# Patient Record
Sex: Female | Born: 1970 | Race: Black or African American | Hispanic: No | Marital: Single | Smoking: Current every day smoker
Health system: Southern US, Community
[De-identification: ages and names within clinical notes are randomized; demographics above are authoritative.]

---

## 2000-01-25 ENCOUNTER — Inpatient Hospital Stay (HOSPITAL_COMMUNITY): Admission: AD | Admit: 2000-01-25 | Discharge: 2000-01-25 | Payer: Self-pay | Admitting: Obstetrics

## 2003-09-11 ENCOUNTER — Emergency Department (HOSPITAL_COMMUNITY): Admission: EM | Admit: 2003-09-11 | Discharge: 2003-09-11 | Payer: Self-pay | Admitting: Emergency Medicine

## 2004-01-21 ENCOUNTER — Emergency Department (HOSPITAL_COMMUNITY): Admission: AD | Admit: 2004-01-21 | Discharge: 2004-01-21 | Payer: Self-pay | Admitting: Family Medicine

## 2004-02-02 ENCOUNTER — Emergency Department (HOSPITAL_COMMUNITY): Admission: EM | Admit: 2004-02-02 | Discharge: 2004-02-02 | Payer: Self-pay | Admitting: Family Medicine

## 2004-05-04 ENCOUNTER — Emergency Department (HOSPITAL_COMMUNITY): Admission: EM | Admit: 2004-05-04 | Discharge: 2004-05-04 | Payer: Self-pay | Admitting: Family Medicine

## 2004-12-06 ENCOUNTER — Emergency Department (HOSPITAL_COMMUNITY): Admission: EM | Admit: 2004-12-06 | Discharge: 2004-12-06 | Payer: Self-pay | Admitting: Family Medicine

## 2005-05-01 ENCOUNTER — Emergency Department (HOSPITAL_COMMUNITY): Admission: EM | Admit: 2005-05-01 | Discharge: 2005-05-01 | Payer: Self-pay | Admitting: Family Medicine

## 2005-05-29 IMAGING — CR DG CHEST 2V
2 series · 2 of 2 positions shown · non-contrast
Comparison: none

CLINICAL DATA: Shortness of breath. 
 CHEST - TWO VIEW:

[view not recorded (1 of 2)]
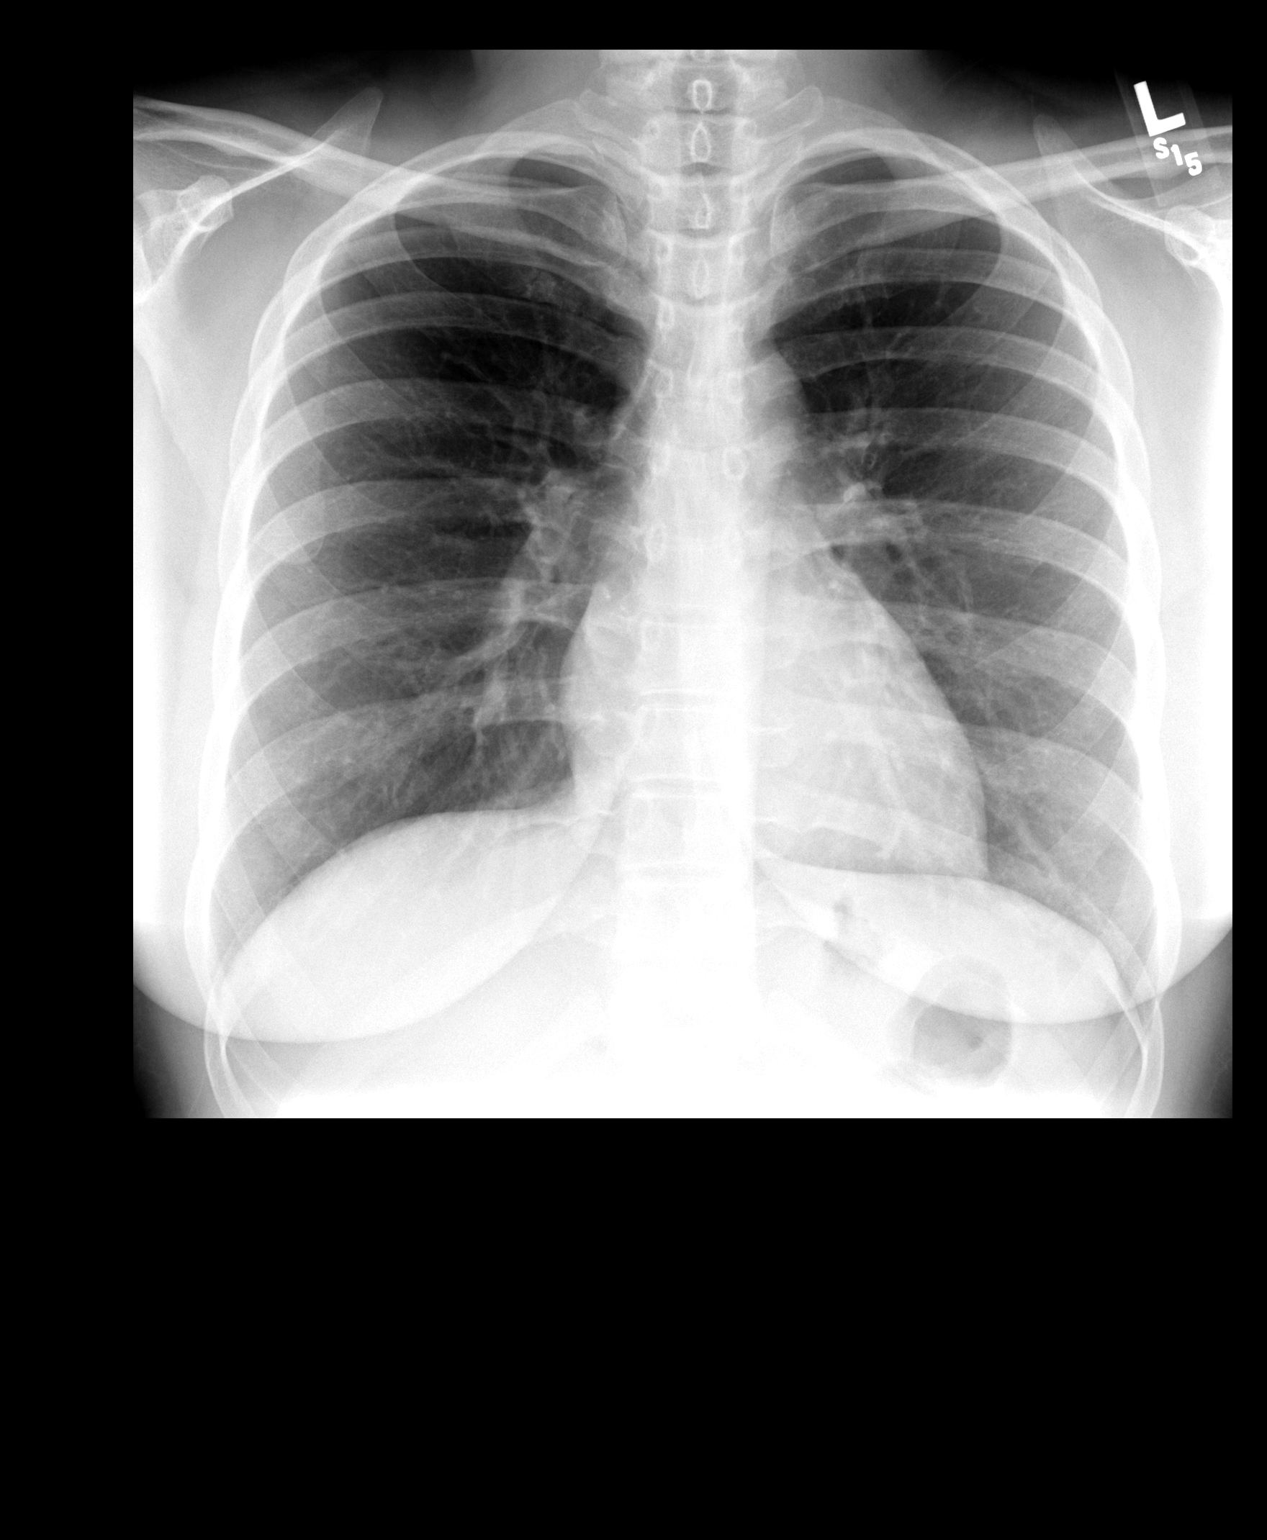

[view not recorded (2 of 2)]
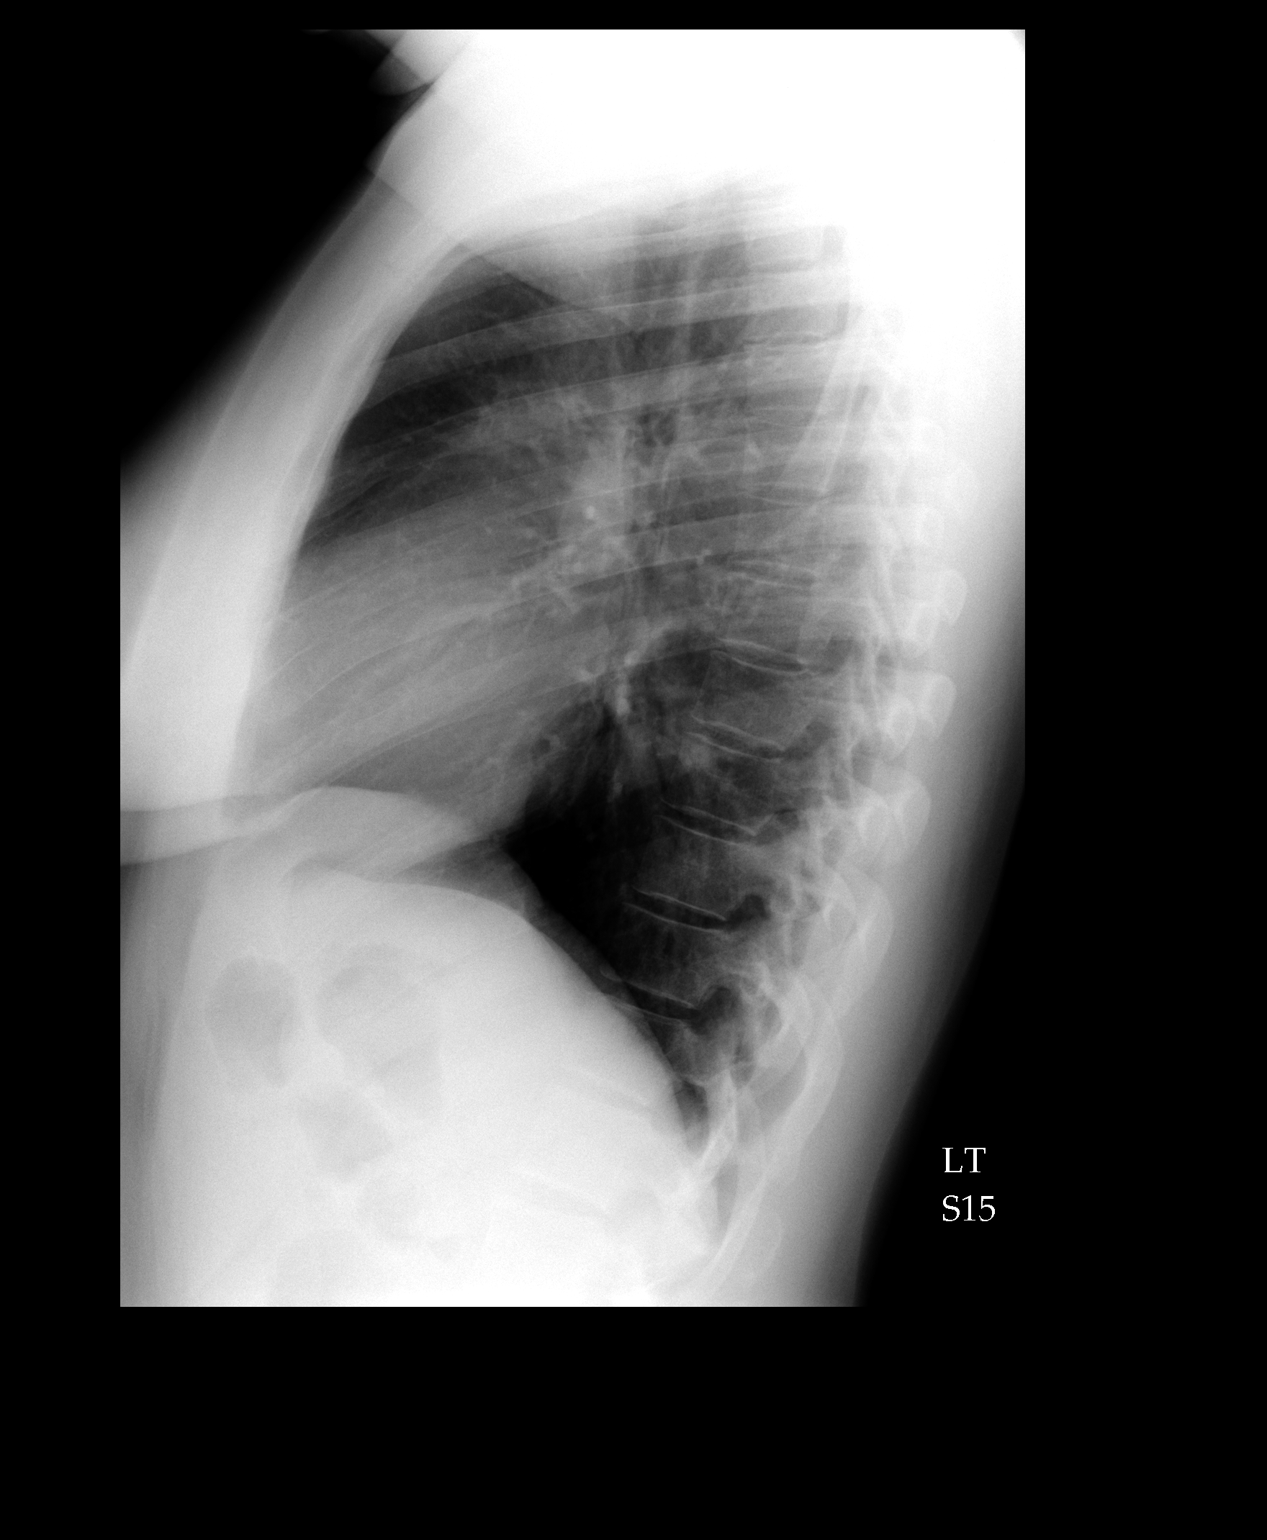

[2 of 2 positions shown; findings below may reference images not displayed]

FINDINGS: Lungs are clear.  No pleural effusion.  Heart and mediastinum appear normal.  No bony abnormality.
IMPRESSION: Negative chest.

## 2006-01-09 ENCOUNTER — Emergency Department (HOSPITAL_COMMUNITY): Admission: EM | Admit: 2006-01-09 | Discharge: 2006-01-09 | Payer: Self-pay | Admitting: Emergency Medicine

## 2006-04-13 ENCOUNTER — Emergency Department (HOSPITAL_COMMUNITY): Admission: EM | Admit: 2006-04-13 | Discharge: 2006-04-13 | Payer: Self-pay | Admitting: Emergency Medicine

## 2006-12-10 ENCOUNTER — Emergency Department (HOSPITAL_COMMUNITY): Admission: EM | Admit: 2006-12-10 | Discharge: 2006-12-10 | Payer: Self-pay | Admitting: Family Medicine

## 2007-04-19 ENCOUNTER — Emergency Department (HOSPITAL_COMMUNITY): Admission: EM | Admit: 2007-04-19 | Discharge: 2007-04-19 | Payer: Self-pay | Admitting: Emergency Medicine

## 2007-04-20 ENCOUNTER — Emergency Department (HOSPITAL_COMMUNITY): Admission: EM | Admit: 2007-04-20 | Discharge: 2007-04-20 | Payer: Self-pay | Admitting: Family Medicine

## 2007-05-08 ENCOUNTER — Emergency Department (HOSPITAL_COMMUNITY): Admission: EM | Admit: 2007-05-08 | Discharge: 2007-05-08 | Payer: Self-pay | Admitting: Emergency Medicine

## 2007-06-23 ENCOUNTER — Emergency Department (HOSPITAL_COMMUNITY): Admission: EM | Admit: 2007-06-23 | Discharge: 2007-06-23 | Payer: Self-pay | Admitting: Family Medicine

## 2008-12-21 ENCOUNTER — Ambulatory Visit: Payer: Self-pay | Admitting: Gynecologic Oncology

## 2009-01-21 ENCOUNTER — Ambulatory Visit: Payer: Self-pay | Admitting: Gynecologic Oncology

## 2009-01-26 ENCOUNTER — Ambulatory Visit: Payer: Self-pay | Admitting: Gynecologic Oncology

## 2009-02-20 ENCOUNTER — Ambulatory Visit: Payer: Self-pay | Admitting: Gynecologic Oncology

## 2011-08-04 LAB — POCT URINALYSIS DIP (DEVICE)
Glucose, UA: NEGATIVE
Hgb urine dipstick: NEGATIVE
Nitrite: NEGATIVE
Specific Gravity, Urine: 1.015
Urobilinogen, UA: 0.2
pH: 6

## 2011-08-04 LAB — POCT PREGNANCY, URINE: Preg Test, Ur: NEGATIVE

## 2011-08-09 LAB — POCT URINALYSIS DIP (DEVICE)
Bilirubin Urine: NEGATIVE
Glucose, UA: NEGATIVE
Hgb urine dipstick: NEGATIVE
Ketones, ur: NEGATIVE
Nitrite: NEGATIVE
Operator id: 247071
Protein, ur: NEGATIVE
Specific Gravity, Urine: 1.01
Urobilinogen, UA: 0.2
pH: 6

## 2011-08-09 LAB — WET PREP, GENITAL
Trich, Wet Prep: NONE SEEN
Yeast Wet Prep HPF POC: NONE SEEN

## 2011-08-09 LAB — URINE CULTURE: Colony Count: 6000

## 2011-08-09 LAB — GC/CHLAMYDIA PROBE AMP, GENITAL
Chlamydia, DNA Probe: NEGATIVE
GC Probe Amp, Genital: NEGATIVE

## 2011-08-09 LAB — POCT PREGNANCY, URINE: Operator id: 247071

## 2011-08-18 ENCOUNTER — Inpatient Hospital Stay (INDEPENDENT_AMBULATORY_CARE_PROVIDER_SITE_OTHER)
Admission: RE | Admit: 2011-08-18 | Discharge: 2011-08-18 | Disposition: A | Discharge status: Home / Self Care | Payer: 59 | Source: Ambulatory Visit | Attending: Emergency Medicine | Admitting: Emergency Medicine

## 2011-08-18 DIAGNOSIS — R109 Unspecified abdominal pain: Secondary | ICD-10-CM

## 2011-11-24 ENCOUNTER — Emergency Department (INDEPENDENT_AMBULATORY_CARE_PROVIDER_SITE_OTHER)
Admission: EM | Admit: 2011-11-24 | Discharge: 2011-11-24 | Disposition: A | Discharge status: Home / Self Care | Payer: 59 | Source: Home / Self Care

## 2011-11-24 ENCOUNTER — Encounter (HOSPITAL_COMMUNITY): Payer: Self-pay | Admitting: Emergency Medicine

## 2011-11-24 DIAGNOSIS — N390 Urinary tract infection, site not specified: Secondary | ICD-10-CM

## 2011-11-24 LAB — POCT PREGNANCY, URINE: Preg Test, Ur: NEGATIVE

## 2011-11-24 LAB — POCT URINALYSIS DIP (DEVICE)
Glucose, UA: NEGATIVE mg/dL
Hgb urine dipstick: NEGATIVE
Specific Gravity, Urine: 1.015 (ref 1.005–1.030)

## 2011-11-24 MED ORDER — CIPROFLOXACIN HCL 500 MG PO TABS: 500.0000 mg | ORAL_TABLET | Freq: Two times a day (BID) | ORAL | Status: AC

## 2011-11-24 MED ORDER — MELOXICAM 7.5 MG PO TABS: 7.5000 mg | ORAL_TABLET | Freq: Every day | ORAL | Status: DC

## 2011-11-24 MED ORDER — PHENAZOPYRIDINE HCL 200 MG PO TABS: 200.0000 mg | ORAL_TABLET | Freq: Three times a day (TID) | ORAL | Status: AC | PRN

## 2011-11-24 NOTE — ED Provider Notes (Signed)
History     CSN: [id number]  Arrival date & time 11/24/11  1928   None     Chief Complaint  Patient presents with  . Urinary Tract Infection    (Consider location/radiation/quality/duration/timing/severity/associated sxs/prior treatment) HPI Comments: 41 y/o smoker female no significant PMH. Here c/o burning at the end of urination, urgency and frequency for 3 weeks. Used monistat vaginal cream with improvement of her symptoms but dysuria is back, denies vaginal discharge or itchiness. No pelvic pain. No fever or chills, has had nausea but no vomiting.    History reviewed. No pertinent past medical history.  History reviewed. No pertinent past surgical history.  No family history on file.  History  Substance Use Topics  . Smoking status: Current Everyday Smoker  . Smokeless tobacco: Not on file  . Alcohol Use: No    OB History    Grav Para Term Preterm Abortions TAB SAB Ect Mult Living                  Review of Systems  Constitutional: Negative for fever and chills.  Gastrointestinal: Positive for nausea. Negative for vomiting, abdominal pain and diarrhea.  Genitourinary: Positive for dysuria, urgency and frequency. Negative for hematuria, flank pain, vaginal bleeding, vaginal discharge, genital sores, menstrual problem and pelvic pain.  Neurological: Negative for dizziness and headaches.    Allergies  Review of patient's allergies indicates no known allergies.  Home Medications   Current Outpatient Rx  Name Route Sig Dispense Refill  . CIPROFLOXACIN HCL 500 MG PO TABS Oral Take 1 tablet (500 mg total) by mouth every 12 (twelve) hours. 10 tablet 0  . MELOXICAM 7.5 MG PO TABS Oral Take 1 tablet (7.5 mg total) by mouth daily. As needed. 30 tablet 0  . PHENAZOPYRIDINE HCL 200 MG PO TABS Oral Take 1 tablet (200 mg total) by mouth 3 (three) times daily as needed for pain. 10 tablet 0    BP 120/76  Pulse 91  Temp(Src) 98 F (36.7 C) (Oral)  Resp 16  SpO2 99%   LMP 11/12/2011  Physical Exam  Nursing note and vitals reviewed. Constitutional: She is oriented to person, place, and time. She appears well-developed and well-nourished. No distress.  Cardiovascular: Normal heart sounds.   Pulmonary/Chest: Breath sounds normal.  Abdominal: Soft. There is no tenderness.       No CVT  Neurological: She is alert and oriented to person, place, and time.    ED Course  Procedures (including critical care time)  Labs Reviewed  POCT URINALYSIS DIP (DEVICE) - Abnormal; Notable for the following:    Ketones, ur TRACE (*)    Leukocytes, UA SMALL (*) Biochemical Testing Only. Please order routine urinalysis from main lab if confirmatory testing is needed.   All other components within normal limits  POCT PREGNANCY, URINE  LAB REPORT - SCANNED   No results found.   1. UTI (lower urinary tract infection)       MDM  Treated with Cipro and pyridium.         Sharin Grave, MD 11/26/11 1034

## 2011-11-24 NOTE — ED Notes (Signed)
PT HEREWITH UTI SX FREQ/URGE AND DYSURIA.SX STARTED X 3 WKS AGO AND USED THE MONISTAT X 3 DYS AND SX IMPROVED BUT RETURNED X 1 WEEK LATER.NO FEVERS,CHILLS,N,V.NO BURN OR ITCH NOTED

## 2011-11-27 ENCOUNTER — Telehealth (HOSPITAL_COMMUNITY): Payer: Self-pay | Admitting: *Deleted

## 2011-12-15 ENCOUNTER — Emergency Department (HOSPITAL_COMMUNITY)
Admission: EM | Admit: 2011-12-15 | Discharge: 2011-12-16 | Disposition: A | Discharge status: Home / Self Care | Payer: 59 | Attending: Emergency Medicine | Admitting: Emergency Medicine

## 2011-12-15 ENCOUNTER — Emergency Department (HOSPITAL_COMMUNITY): Payer: 59

## 2011-12-15 ENCOUNTER — Encounter (HOSPITAL_COMMUNITY): Payer: Self-pay | Admitting: Emergency Medicine

## 2011-12-15 DIAGNOSIS — R209 Unspecified disturbances of skin sensation: Secondary | ICD-10-CM | POA: Insufficient documentation

## 2011-12-15 DIAGNOSIS — R109 Unspecified abdominal pain: Secondary | ICD-10-CM | POA: Insufficient documentation

## 2011-12-15 DIAGNOSIS — R102 Pelvic and perineal pain: Secondary | ICD-10-CM

## 2011-12-15 DIAGNOSIS — N949 Unspecified condition associated with female genital organs and menstrual cycle: Secondary | ICD-10-CM | POA: Insufficient documentation

## 2011-12-15 LAB — URINALYSIS, ROUTINE W REFLEX MICROSCOPIC
Bilirubin Urine: NEGATIVE
Glucose, UA: NEGATIVE mg/dL
Hgb urine dipstick: NEGATIVE
Ketones, ur: NEGATIVE mg/dL
Leukocytes, UA: NEGATIVE
Nitrite: NEGATIVE
Protein, ur: NEGATIVE mg/dL
Specific Gravity, Urine: 1.007 (ref 1.005–1.030)
Urobilinogen, UA: 0.2 mg/dL (ref 0.0–1.0)
pH: 7.5 (ref 5.0–8.0)

## 2011-12-15 LAB — WET PREP, GENITAL
Clue Cells Wet Prep HPF POC: NONE SEEN
Trich, Wet Prep: NONE SEEN
Yeast Wet Prep HPF POC: NONE SEEN

## 2011-12-15 LAB — POCT PREGNANCY, URINE: Preg Test, Ur: NEGATIVE

## 2011-12-15 MED ORDER — IBUPROFEN 800 MG PO TABS
800.0000 mg | ORAL_TABLET | Freq: Once | ORAL | Status: AC
  Administered 2011-12-15: 800 mg via ORAL
  Filled 2011-12-15: qty 1

## 2011-12-15 NOTE — ED Notes (Signed)
Pt to and from pelvic room with NA.

## 2011-12-15 NOTE — ED Notes (Signed)
Pt to RR for CCUS.

## 2011-12-15 NOTE — ED Provider Notes (Signed)
History     CSN: [id number]  Arrival date & time 12/15/11  1933   First MD Initiated Contact with Patient 12/15/11 2032      Chief Complaint  Patient presents with  . Flank Pain    (Consider location/radiation/quality/duration/timing/severity/associated sxs/prior treatment) Patient is a 41 y.o. female presenting with flank pain. The history is provided by the patient.  Flank Pain This is a recurrent problem. The current episode started more than 1 month ago. The problem occurs intermittently. The problem has been gradually worsening. Associated symptoms include abdominal pain and urinary symptoms. Pertinent negatives include no change in bowel habit, chills, fever, numbness or vomiting.  Patient reports persistent, intermittent right flank pain since October. Was seen by her GYN, discussed the pain with her GYN and apparently had a normal annual exam. Was seen at the urgent care in late October and put on meloxicam which she said seemed to help initially but pain soon returned. [facility] early February and treated for a UTI. Symptoms actually seemed to get worse during that time. She now denies dysuria but states she does have a sense that she's not emptying her bladder when she urinates. And states the pain at times radiates into her right leg and recently she has started to experience right lower pelvic pain as well. States the right lower pelvic pain is now her main concern. States was told at the urgent care if her pain persisted she may need to come to the emergency department for an ultrasound. Patient denies vaginal discharge, states she has not been sexually active in over 4 years and as previously mentioned had a normal annual exam in October. History reviewed. No pertinent past medical history.  History reviewed. No pertinent past surgical history.  History reviewed. No pertinent family history.  History  Substance Use Topics  . Smoking status: Current  Everyday Smoker    Types: Cigarettes  . Smokeless tobacco: Not on file  . Alcohol Use: No    OB History    Grav Para Term Preterm Abortions TAB SAB Ect Mult Living                  Review of Systems  Constitutional: Negative.  Negative for fever and chills.  HENT: Negative.   Eyes: Negative.   Respiratory: Negative.   Cardiovascular: Negative.   Gastrointestinal: Positive for abdominal pain. Negative for vomiting and change in bowel habit.  Genitourinary: Positive for flank pain.  Musculoskeletal: Negative.   Skin: Negative.   Neurological: Negative.  Negative for numbness.  Hematological: Negative.   Psychiatric/Behavioral: Negative.     Allergies  Review of patient's allergies indicates no known allergies.  Home Medications   Current Outpatient Rx  Name Route Sig Dispense Refill  . MELOXICAM 7.5 MG PO TABS Oral Take 7.5 mg by mouth daily.      BP 125/84  Pulse 87  Temp(Src) 98.5 F (36.9 C) (Oral)  SpO2 100%  LMP 12/05/2011  Physical Exam  Constitutional: She is oriented to person, place, and time. She appears well-developed and well-nourished.  HENT:  Head: Normocephalic and atraumatic.  Eyes: Conjunctivae are normal.  Neck: Neck supple.  Cardiovascular: Normal rate and regular rhythm.   Pulmonary/Chest: Effort normal and breath sounds normal.  Abdominal: Soft. Bowel sounds are normal.  Genitourinary: Vagina normal. Cervix exhibits no motion tenderness, no discharge and no friability. Right adnexum displays tenderness.       Exam somewhat limited due  to pt's body habitus. Noted what appears to be tiny flesh colored piece of tissue protruding from cervix.    Musculoskeletal: Normal range of motion.  Neurological: She is alert and oriented to person, place, and time.  Skin: Skin is warm and dry. No erythema.  Psychiatric: She has a normal mood and affect.    ED Course  Procedures  And findings and clinical impression discussed patient who reports some  relief of discomfort with ibuprofen. Will plan for discharge home on 3 day regimen of ibuprofen and short course of medication for pain. Patient is to arrange follow up with her primary care physician inside of her insurance network for further evaluation of persistent right side and right lower pelvic pain..   Labs Reviewed  URINALYSIS, ROUTINE W REFLEX MICROSCOPIC  POCT PREGNANCY, URINE  WET PREP, GENITAL  GC/CHLAMYDIA PROBE AMP, GENITAL   No results found.   No diagnosis found.    MDM  HPI/PE and clinical findings c/w  1. Persistent, intermittent  (R) flank and lower pelvic pain x 4 months (u/a negative, wet prep unremarkable, pelvic u/s w/o acute findings, remaining PE unremarkable) This has components of possible muscle or ligament strain. Will try NSAIDS.        Leanne Chang, NP 12/16/11 [phone]

## 2011-12-15 NOTE — ED Notes (Signed)
Provided pt with crackers and PB

## 2011-12-15 NOTE — ED Notes (Signed)
Patient with pain in right flank.  Patient has been dealing with same pain for months, pain getting worse and now it is going into groin.

## 2011-12-15 NOTE — ED Notes (Signed)
MD in to see pt.  Urine to lab.  Pt resting.

## 2011-12-16 LAB — GC/CHLAMYDIA PROBE AMP, GENITAL
Chlamydia, DNA Probe: NEGATIVE
GC Probe Amp, Genital: NEGATIVE

## 2011-12-16 MED ORDER — HYDROCODONE-ACETAMINOPHEN 5-325 MG PO TABS: 1.0000 | ORAL_TABLET | ORAL | Status: AC | PRN

## 2011-12-16 MED ORDER — IBUPROFEN 400 MG PO TABS: 400.0000 mg | ORAL_TABLET | Freq: Three times a day (TID) | ORAL | Status: AC | PRN

## 2011-12-16 NOTE — Discharge Instructions (Signed)
Please review the instructions below. Take the ibuprofen 3 times a day for 3 days as directed and then only as needed. Take the medication for pain as directed. Continue your effort at getting established with a primary care physician and arrange followup for further evaluation of your persistent right flank and lower pelvic pain. Return if symptoms become acutely worse.  Abdominal Pain Many things can cause belly (abdominal) pain. Most times, the belly pain is not dangerous. The amount of belly pain does not tell how serious the problem may be. Many cases of belly pain can be watched and treated at home. HOME CARE   Do not take medicines that help you go poop (laxatives) unless told to by your doctor.   Only take medicine as told by your doctor.   Eat or drink as told by your doctor. Your doctor will tell you if you should be on a special diet.  GET HELP RIGHT AWAY IF:   The pain does not go away.   You have a fever.   You keep throwing up (vomiting).   The pain changes and is only in the right or left part of the belly.   You have bloody or tarry looking poop.  MAKE SURE YOU:   Understand these instructions.   Will watch your condition.   Will get help right away if you are not doing well or get worse.  Document Released: 03/27/2008 Document Revised: 06/21/2011 Document Reviewed: 10/25/2009 [facility] Patient Information 2012 [place], [place].Flank Pain Flank pain refers to pain that is located on the side of the body between the upper abdomen and the back. It can be caused by many things. CAUSES  Some of the more common causes of flank pain include:  Muscle strain.   Muscle spasms.   A disease of your spine (vertebral disk disease).   A lung infection (pneumonia).   Fluid around your lungs (pulmonary edema).   A kidney infection.   Kidney stones.   A very painful skin rash on only one side of your body (shingles).   Gallbladder disease.  DIAGNOSIS  Blood tests,  urine tests, and X-rays may help your caregiver determine what is wrong. TREATMENT  The treatment of pain depends on the cause. Your caregiver will determine what treatment will work best for you. HOME CARE INSTRUCTIONS   Home care will depend on the cause of your pain.   Some medications may help relieve the pain. Take medication for relief of pain as directed by your caregiver.   Tell your caregiver about any changes in your pain.   Follow up with your caregiver.  SEEK IMMEDIATE MEDICAL CARE IF:   Your pain is not controlled with medication.   The pain increases.   You have abdominal pain.   You have shortness of breath.   You have persistent nausea or vomiting.   You have swelling in your abdomen.   You feel faint or pass out.   You have a temperature by mouth above 102 F (38.9 C), not controlled by medicine.  MAKE SURE YOU:   Understand these instructions.   Will watch your condition.   Will get help right away if you are not doing well or get worse.  Document Released: 11/30/2005 Document Revised: 06/21/2011 Document Reviewed: 03/26/2010 [facility] Patient Information 2012 [place], [place].

## 2011-12-16 NOTE — ED Provider Notes (Signed)
See prior note   Lyndzee Kliebert L Wade Sigala, MD 12/16/11 1337 

## 2012-06-06 IMAGING — US US PELVIS COMPLETE
1 series · 14 of 25 positions shown · non-contrast
Comparison: None.

CLINICAL DATA: 40-year-old female with lower quadrant and flank
pain.

TRANSABDOMINAL AND TRANSVAGINAL ULTRASOUND OF PELVIS
TECHNIQUE: Both transabdominal and transvaginal ultrasound
examinations of the pelvis were performed. Transabdominal technique
was performed for global imaging of the pelvis including uterus,
ovaries, adnexal regions, and pelvic cul-de-sac.

[Series 1: us pelvis complete · 0.21mm/px · 14 of 61 slices shown]
[im 1/61]
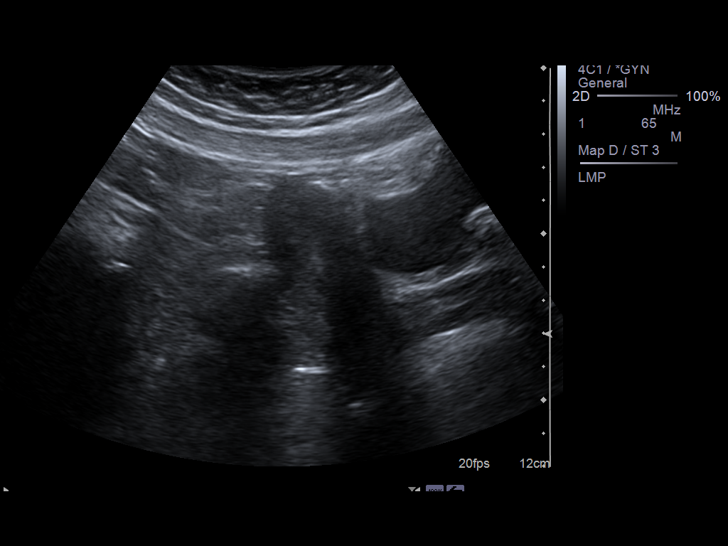
[im 6/61]
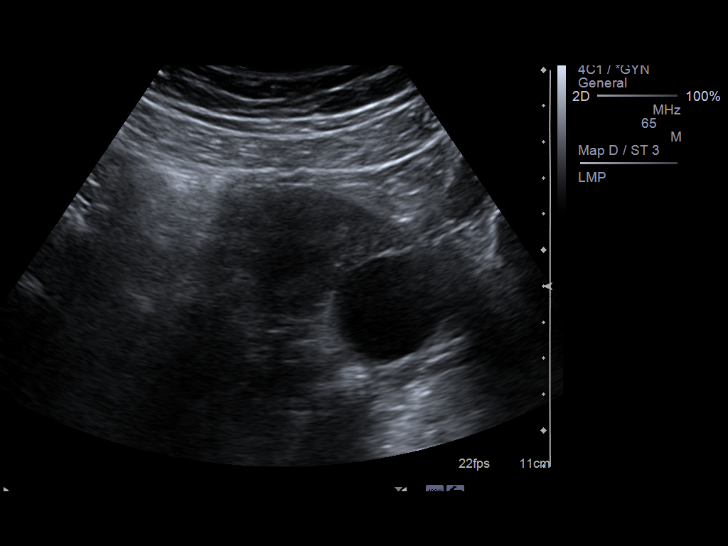
[im 11/61]
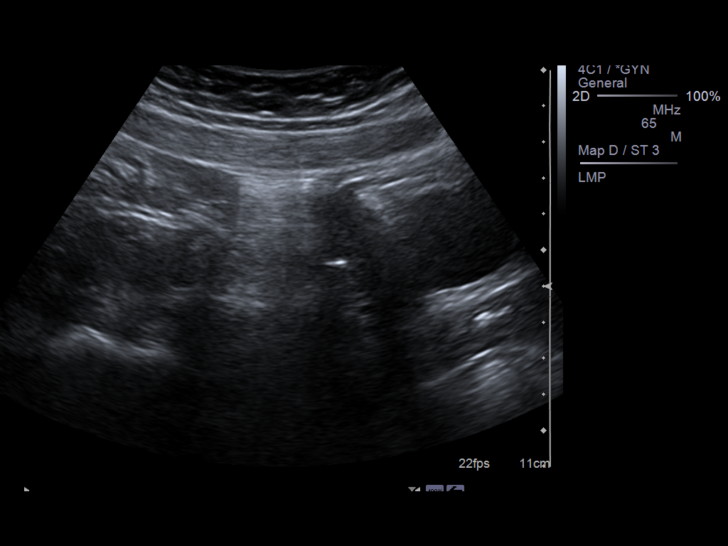
[im 16/61]
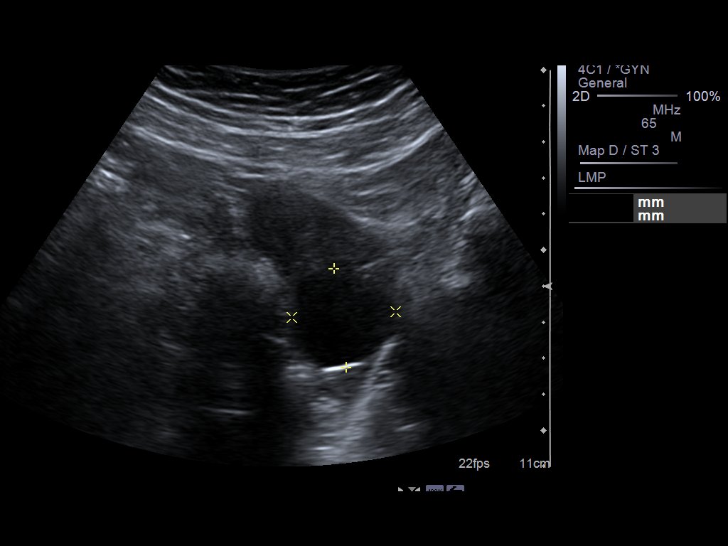
[im 21/61]
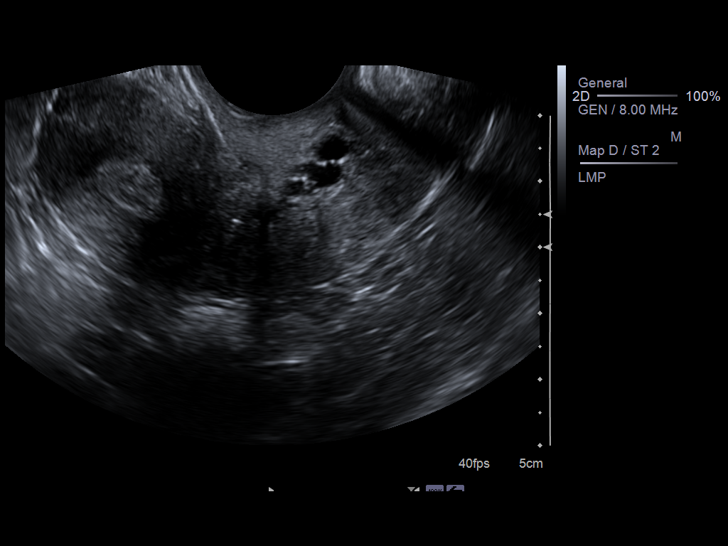
[im 23/61]
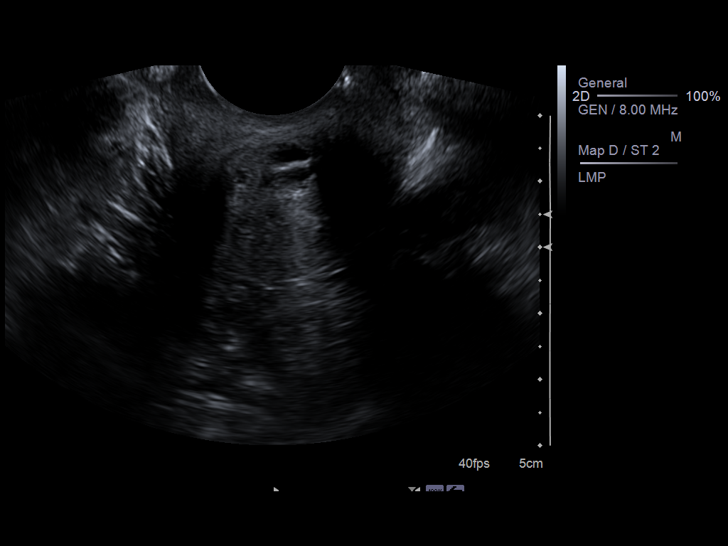
[im 28/61]
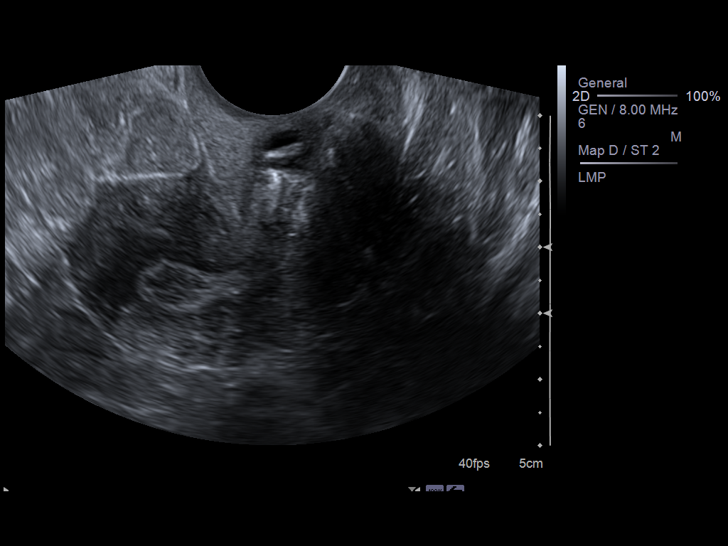
[im 33/61]
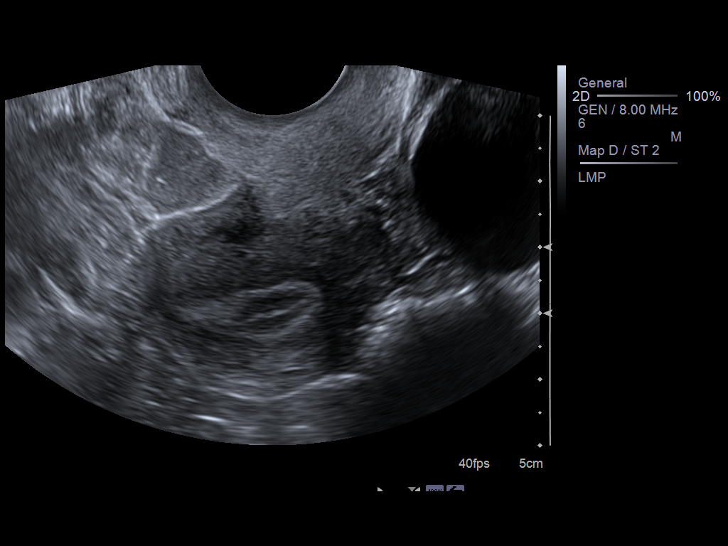
[im 38/61]
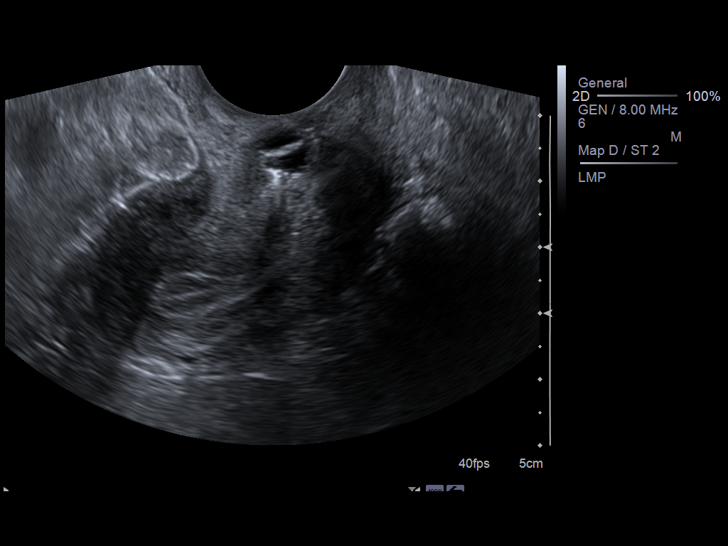
[im 41/61]
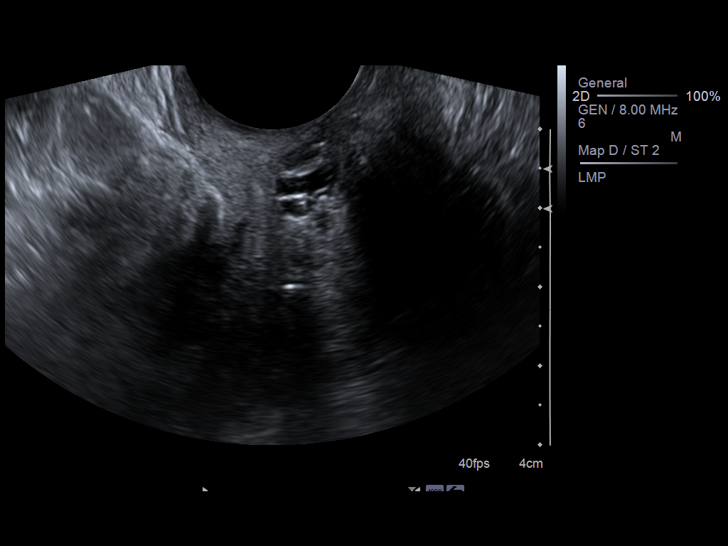
[im 46/61]
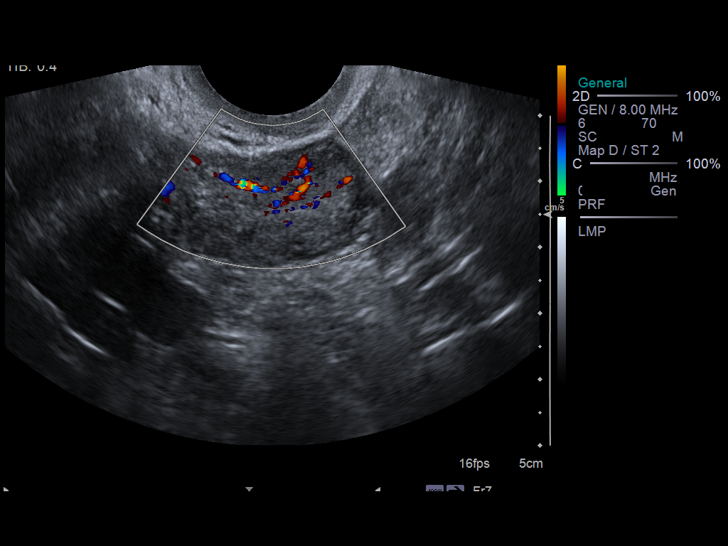
[im 51/61]
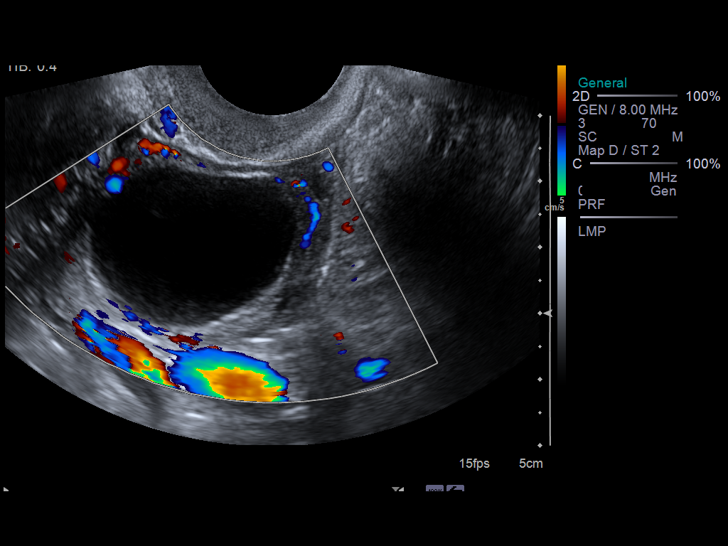
[im 56/61]
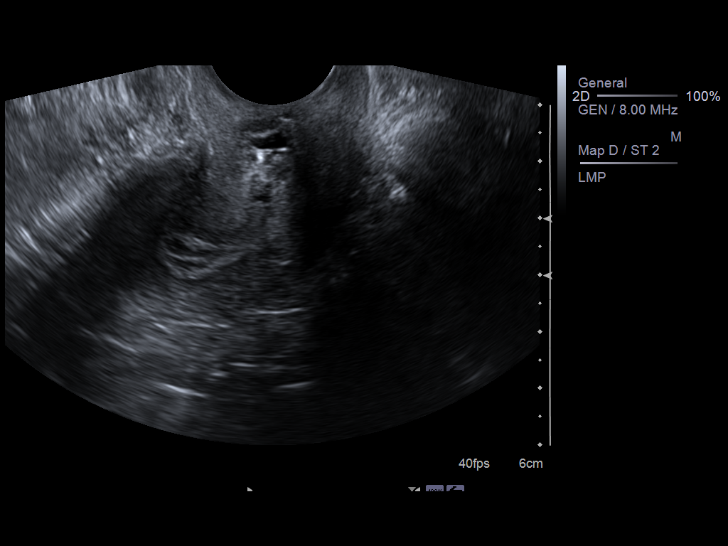
[im 61/61]
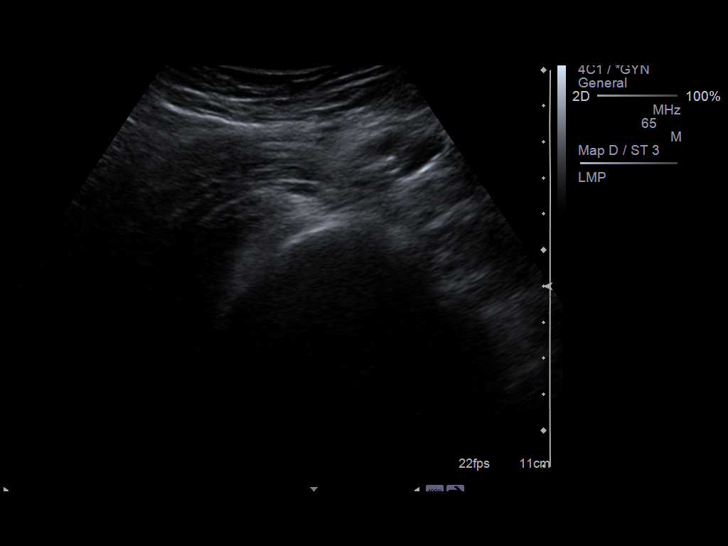

[14 of 25 positions shown; findings below may reference images not displayed]

FINDINGS: Uterus: Normal in size and echotexture measuring 5.9 x 2.7 x
cm.  Multiple cystic areas in the lower uterine segment/cervix
likely are Nabothian cyst.

Endometrium: Homogeneous measuring up to 6 mm in thickness.

Right ovary:  Normal measuring 3.1 x 1.6 x 2.5 cm.  Dominant
follicle measuring 6 mm.

Left ovary: Large simple appearing cyst measuring up to 36 mm in
diameter.  No internal architecture or vascularity.  The remainder
of the left ovarian parenchyma is within normal limits, altogether
4.0 x 3.2 x 3.3 cm.

Other findings: No pelvic free fluid.
IMPRESSION: Simple, physiologic appearing 36 mm left ovarian cyst.  Otherwise
normal pelvic ultrasound.

## 2012-11-10 ENCOUNTER — Encounter (HOSPITAL_COMMUNITY): Payer: Self-pay

## 2012-11-10 ENCOUNTER — Emergency Department (INDEPENDENT_AMBULATORY_CARE_PROVIDER_SITE_OTHER)
Admission: EM | Admit: 2012-11-10 | Discharge: 2012-11-10 | Disposition: A | Discharge status: Home / Self Care | Payer: 59 | Source: Home / Self Care | Attending: Emergency Medicine | Admitting: Emergency Medicine

## 2012-11-10 DIAGNOSIS — J039 Acute tonsillitis, unspecified: Secondary | ICD-10-CM

## 2012-11-10 LAB — POCT RAPID STREP A: Streptococcus, Group A Screen (Direct): NEGATIVE

## 2012-11-10 MED ORDER — AMOXICILLIN 500 MG PO CAPS: 500.0000 mg | ORAL_CAPSULE | Freq: Three times a day (TID) | ORAL | Status: DC

## 2012-11-10 MED ORDER — TRAMADOL HCL 50 MG PO TABS: 100.0000 mg | ORAL_TABLET | Freq: Three times a day (TID) | ORAL | Status: DC | PRN

## 2012-11-10 MED ORDER — PREDNISONE 5 MG PO KIT: 1.0000 | PACK | Freq: Every day | ORAL | Status: DC

## 2012-11-10 NOTE — ED Notes (Signed)
Sore throat since Monday, ear pain, minimal relief w OTC remedies

## 2012-11-10 NOTE — ED Provider Notes (Signed)
Chief Complaint  Patient presents with  . Sore Throat    History of Present Illness:   Kathleen Spence is a 42 year old female who has had a one-week history of sore throat, worse in the right and left, pain on swallowing, and chills. She denies any fever, headache, nasal congestion, rhinorrhea, swollen glands , cough, or GI symptoms. She has not been exposed to strep, mono, or influenza.   Review of Systems:  Other than as noted above, the patient denies any of the following symptoms. Systemic:  No fever, chills, sweats, fatigue, myalgias, headache, or anorexia. Eye:  No redness, pain or drainage. ENT:  No earache, ear congestion, nasal congestion, sneezing, rhinorrhea, sinus pressure, sinus pain, or post nasal drip. Lungs:  No cough, sputum production, wheezing, shortness of breath, or chest pain. GI:  No abdominal pain, nausea, vomiting, or diarrhea. Skin:  No rash or itching.  PMFSH:  Past medical history, family history, social history, meds, allergies, and nurse's notes were reviewed.  There is no known exposure to strep or mono.  No prior history of step or mono.  The patient denies use of tobacco.  Physical Exam:   Vital signs:  BP 138/95  Pulse 81  Temp 99.6 F (37.6 C) (Oral)  Resp 19  SpO2 100% General:  Alert, in no distress. Eye:  No conjunctival injection or drainage. Lids were normal. ENT:  TMs and canals were normal, without erythema or inflammation.  Nasal mucosa was clear and uncongested, without drainage.  Mucous membranes were moist.  Exam of pharynx  tonsils were slightly enlarged and erythematous, with right more so than left and there were tiny spots of white exudate on the tonsils.  There were no oral ulcerations or lesions. Neck:  Supple, no adenopathy, tenderness or mass. Lungs:  No respiratory distress.  Lungs were clear to auscultation, without wheezes, rales or rhonchi.  Breath sounds were clear and equal bilaterally.  Heart:  Regular rhythm, without  gallops, murmers or rubs. Skin:  Clear, warm, and dry, without rash or lesions.  Labs:   Results for orders placed during the hospital encounter of 11/10/12  POCT RAPID STREP A ([facility]ARE ONLY)      Component Value Range   Streptococcus, Group A Screen (Direct) NEGATIVE  NEGATIVE    Assessment:  The encounter diagnosis was Tonsillitis.  Plan:   1.  The following meds were prescribed:   New Prescriptions   AMOXICILLIN (AMOXIL) 500 MG CAPSULE    Take 1 capsule (500 mg total) by mouth 3 (three) times daily.   PREDNISONE 5 MG KIT    Take 1 kit (5 mg total) by mouth daily after breakfast. Prednisone 5 mg 6 day dosepack.  Take as directed.   TRAMADOL (ULTRAM) 50 MG TABLET    Take 2 tablets (100 mg total) by mouth every 8 (eight) hours as needed for pain.   2.  The patient was instructed in symptomatic care including hot saline gargles, throat lozenges, infectious precautions, and need to trade out toothbrush. Handouts were given. 3.  The patient was told to return if becoming worse in any way, if no better in 3 or 4 days, and given some red flag symptoms that would indicate earlier return.    Reuben Likes, MD 11/10/12 [phone]

## 2013-04-24 ENCOUNTER — Encounter (HOSPITAL_COMMUNITY): Payer: Self-pay | Admitting: *Deleted

## 2013-04-24 ENCOUNTER — Emergency Department (INDEPENDENT_AMBULATORY_CARE_PROVIDER_SITE_OTHER)
Admission: EM | Admit: 2013-04-24 | Discharge: 2013-04-24 | Disposition: A | Discharge status: Home / Self Care | Payer: 59 | Source: Home / Self Care | Attending: Family Medicine | Admitting: Family Medicine

## 2013-04-24 DIAGNOSIS — M5430 Sciatica, unspecified side: Secondary | ICD-10-CM

## 2013-04-24 DIAGNOSIS — M5431 Sciatica, right side: Secondary | ICD-10-CM

## 2013-04-24 LAB — POCT URINALYSIS DIP (DEVICE)
Glucose, UA: NEGATIVE mg/dL
Ketones, ur: NEGATIVE mg/dL
Leukocytes, UA: NEGATIVE
Specific Gravity, Urine: 1.015 (ref 1.005–1.030)
Urobilinogen, UA: 0.2 mg/dL (ref 0.0–1.0)

## 2013-04-24 MED ORDER — PREDNISONE 20 MG PO TABS: ORAL_TABLET | ORAL | Status: DC

## 2013-04-24 MED ORDER — CYCLOBENZAPRINE HCL 10 MG PO TABS: 10.0000 mg | ORAL_TABLET | Freq: Two times a day (BID) | ORAL | Status: DC | PRN

## 2013-04-24 MED ORDER — HYDROCODONE-ACETAMINOPHEN 5-325 MG PO TABS: 2.0000 | ORAL_TABLET | Freq: Three times a day (TID) | ORAL | Status: DC | PRN

## 2013-04-24 NOTE — ED Provider Notes (Signed)
History    CSN: [id number] Arrival date & time 04/24/13  1950  First MD Initiated Contact with Patient 04/24/13 2007     Chief Complaint  Patient presents with  . Back Pain   (Consider location/radiation/quality/duration/timing/severity/associated sxs/prior Treatment) HPI Comments: 42 year old female here complaining of right lower back pain with radiation to the right hip and lateral thigh up to her lower leg intermittently for the last 2 weeks. Denies lower extremity weakness or numbness although states that she feels cold sensation in the lateral aspect of her right thigh. Denies urinary retention or incontinence. Denies dysuria. (She's currently with her menstrual period ). Denies fever or chills. No abdominal pain nausea, vomiting or diarrhea. Patient states that she had right lower abdominal pain in the past and had a normal pelvic ultrasound. States her current pain feels different. Denies recent falls or direct trauma to her back. She has a Health and safety inspector job. Patient reports tramadol give her a rash. Has taken hydrocodone with no side effects.   No past medical history on file. No past surgical history on file. Family History  Problem Relation Age of Onset  . Diabetes Mother   . Hypertension Mother   . Hypertension Father    History  Substance Use Topics  . Smoking status: Current Every Day Smoker -- 0.50 packs/day    Types: Cigarettes  . Smokeless tobacco: Not on file  . Alcohol Use: No   OB History   Grav Para Term Preterm Abortions TAB SAB Ect Mult Living                 Review of Systems  Constitutional: Negative for fever, chills, diaphoresis, appetite change and fatigue.  Gastrointestinal: Negative for nausea, vomiting, abdominal pain and diarrhea.  Genitourinary: Positive for vaginal bleeding. Negative for dysuria, flank pain, vaginal discharge and pelvic pain.  Musculoskeletal: Positive for back pain.  Skin: Negative for rash.  Neurological: Negative for dizziness and  headaches.  All other systems reviewed and are negative.    Allergies  Tramadol  Home Medications   Current Outpatient Rx  Name  Route  Sig  Dispense  Refill  . cyclobenzaprine (FLEXERIL) 10 MG tablet   Oral   Take 1 tablet (10 mg total) by mouth 2 (two) times daily as needed for muscle spasms.   20 tablet   0   . HYDROcodone-acetaminophen (NORCO/VICODIN) 5-325 MG per tablet   Oral   Take 2 tablets by mouth every 8 (eight) hours as needed for pain.   10 tablet   0   . predniSONE (DELTASONE) 20 MG tablet      2 tab po daily for 5 days   10 tablet   0    BP 136/90  Pulse 84  Temp(Src) 99.2 F (37.3 C)  Resp 14  SpO2 100%  LMP 04/23/2013 Physical Exam  Nursing note and vitals reviewed. Constitutional: She is oriented to person, place, and time. She appears well-developed and well-nourished. No distress.  HENT:  Head: Normocephalic and atraumatic.  Eyes: Conjunctivae are normal. No scleral icterus.  Cardiovascular: Normal heart sounds.   Pulmonary/Chest: Breath sounds normal.  Abdominal: Soft. Bowel sounds are normal. She exhibits no distension and no mass. There is no tenderness. There is no rebound and no guarding.  Musculoskeletal:  Central spine with no scoliosis or kyphosis. Fair anterior flexion and posterior extension. Patient able to walk on tip toes and heels with no foot drop but or pain exacerbation. No bone prominence tenderness. Tenderness  to palpation in bilateral lumbar paravertebral muscles worse on right side. Also tenderness over right SI joint. Negative straight leg test bilateral. Intact sensation and symmetric + DTRs (rotullian and achillean) in low extremities.   Neurological: She is alert and oriented to person, place, and time.  Skin: No rash noted. She is not diaphoretic.    ED Course  Procedures (including critical care time) Labs Reviewed  POCT URINALYSIS DIP (DEVICE) - Abnormal; Notable for the following:    Hgb urine dipstick LARGE  (*)    All other components within normal limits   No results found. 1. Sciatica, right     MDM  Treated with Flexeril, Norco and prednisone. Supportive care and red flags that should prompt return to medical attention discussed with patient and provided in writing. Recommended followup with her primary care provider or spine specialist if persistent and or recurrent symptoms  Sharin Grave, MD 04/24/13 2137

## 2013-04-24 NOTE — ED Notes (Signed)
Pt. asked if we need a urine sample.  Pt. tells me now that she has stinging in her back after she urinates.  Urine sample obtained.

## 2013-04-24 NOTE — ED Notes (Addendum)
C/o pain to R posterier lower rib pain and down to R leg. Taking Tylenol without relief.  Pain goes down outside of R leg to her ankle.  States she fell a year a ago on her R buttocks and still has a dent in her R buttocks.  R ppp.   Woke up diaphoretic and sweating on Sunday.  No chills.

## 2013-07-26 ENCOUNTER — Other Ambulatory Visit (HOSPITAL_COMMUNITY)
Admission: RE | Admit: 2013-07-26 | Discharge: 2013-07-26 | Disposition: A | Discharge status: Home / Self Care | Payer: 59 | Source: Ambulatory Visit | Attending: Family Medicine | Admitting: Family Medicine

## 2013-07-26 ENCOUNTER — Encounter (HOSPITAL_COMMUNITY): Payer: Self-pay | Admitting: Emergency Medicine

## 2013-07-26 ENCOUNTER — Emergency Department (INDEPENDENT_AMBULATORY_CARE_PROVIDER_SITE_OTHER)
Admission: EM | Admit: 2013-07-26 | Discharge: 2013-07-26 | Disposition: A | Discharge status: Home / Self Care | Payer: 59 | Source: Home / Self Care | Attending: Family Medicine | Admitting: Family Medicine

## 2013-07-26 DIAGNOSIS — N760 Acute vaginitis: Secondary | ICD-10-CM | POA: Insufficient documentation

## 2013-07-26 DIAGNOSIS — N3289 Other specified disorders of bladder: Secondary | ICD-10-CM

## 2013-07-26 DIAGNOSIS — Z113 Encounter for screening for infections with a predominantly sexual mode of transmission: Secondary | ICD-10-CM | POA: Insufficient documentation

## 2013-07-26 DIAGNOSIS — R0982 Postnasal drip: Secondary | ICD-10-CM

## 2013-07-26 DIAGNOSIS — M5430 Sciatica, unspecified side: Secondary | ICD-10-CM

## 2013-07-26 LAB — POCT URINALYSIS DIP (DEVICE)
Bilirubin Urine: NEGATIVE
Glucose, UA: NEGATIVE mg/dL
Hgb urine dipstick: NEGATIVE
Nitrite: NEGATIVE

## 2013-07-26 LAB — POCT PREGNANCY, URINE: Preg Test, Ur: NEGATIVE

## 2013-07-26 MED ORDER — METRONIDAZOLE 500 MG PO TABS: 500.0000 mg | ORAL_TABLET | Freq: Two times a day (BID) | ORAL | Status: DC

## 2013-07-26 MED ORDER — FLUTICASONE PROPIONATE 50 MCG/ACT NA SUSP: 2.0000 | Freq: Every day | NASAL | Status: DC

## 2013-07-26 NOTE — ED Provider Notes (Signed)
Kathleen Spence is a 42 y.o. female who presents to Urgent Care today for  1) urinary frequency urgency and dysuria present for about one week. This is associated with some low back pain. Symptoms are consistent with previous urinary tract infections. She's not tried any medications for this problem yet 2) additionally patient notes posterior nasal drip associated with cough and mild sore throat. She notes nasal congestion. She denies any fevers chills nausea vomiting or diarrhea. She feels well otherwise   History reviewed. No pertinent past medical history. History  Substance Use Topics  . Smoking status: Current Every Day Smoker -- 0.50 packs/day    Types: Cigarettes  . Smokeless tobacco: Not on file  . Alcohol Use: No   ROS as above Medications reviewed. No current facility-administered medications for this encounter.   Current Outpatient Prescriptions  Medication Sig Dispense Refill  . fluticasone (FLONASE) 50 MCG/ACT nasal spray Place 2 sprays into the nose daily.  16 g  2  . metroNIDAZOLE (FLAGYL) 500 MG tablet Take 1 tablet (500 mg total) by mouth 2 (two) times daily.  14 tablet  0    Exam:  BP 125/79  Pulse 86  Temp(Src) 99.2 F (37.3 C) (Oral)  Resp 20  SpO2 100%  LMP 07/20/2013 Gen: Well NAD HEENT: EOMI,  MMM, cobblestoning posterior pharynx. Membranes are normal appearing bilaterally Lungs: CTABL Nl WOB Heart: RRR no MRG Abd: NABS, NT, ND, no CV angle tenderness to percussion Exts: Non edematous BL  LE, warm and well perfused.  GYN: Normal external genitalia. Vaginal canal with thin white discharge. Normal-appearing cervix.  Results for orders placed during the hospital encounter of 07/26/13 (from the past 24 hour(s))  POCT URINALYSIS DIP (DEVICE)     Status: None   Collection Time    07/26/13  5:10 PM      Result Value Range   Glucose, UA NEGATIVE  NEGATIVE mg/dL   Bilirubin Urine NEGATIVE  NEGATIVE   Ketones, ur NEGATIVE  NEGATIVE mg/dL   Specific  Gravity, Urine 1.010  1.005 - 1.030   Hgb urine dipstick NEGATIVE  NEGATIVE   pH 6.0  5.0 - 8.0   Protein, ur NEGATIVE  NEGATIVE mg/dL   Urobilinogen, UA 0.2  0.0 - 1.0 mg/dL   Nitrite NEGATIVE  NEGATIVE   Leukocytes, UA NEGATIVE  NEGATIVE  POCT PREGNANCY, URINE     Status: None   Collection Time    07/26/13  5:15 PM      Result Value Range   Preg Test, Ur NEGATIVE  NEGATIVE   No results found.  Assessment and Plan: 41 y.o. female with  1) dysuria with normal-appearing urinalysis. Urine culture is pending. Doubtful for UTI.  Vaginal exam shows some vaginal discharge. This may be bacterial vaginosis. Plan to treat empirically for bacterial vaginosis while awaiting urine culture. May benefit from referral to gynecology for further evaluation 2) postnasal drip: Plan to treat with Flonase nasal spray Discussed warning signs or symptoms. Please see discharge instructions. Patient expresses understanding.      Rodolph Bong, MD 07/26/13 [phone]

## 2013-07-26 NOTE — ED Notes (Signed)
Pt c/o poss UTI onset 1 week Sxs include: urinary freq/urgency, lower back pain, lower abd pressure Also c/o dry throat and PND onset this am Denies: f/v/n/d, hematuria, gyn sxs Alert w/no signs of acute distress.

## 2013-07-28 LAB — URINE CULTURE
Colony Count: 35000
Special Requests: NORMAL

## 2013-07-28 NOTE — ED Notes (Signed)
Lab review

## 2013-07-29 NOTE — ED Notes (Signed)
GC/Chlamydia neg,  Affirm: Candida and Trich neg., Gardnerella pos., Urine culture: 35,000 colonies Multiple bacterial types none predominant.  Pt. adequately treated with Flagyl. Kathleen Spence 07/29/2013

## 2013-08-02 ENCOUNTER — Telehealth (HOSPITAL_COMMUNITY): Payer: Self-pay | Admitting: *Deleted

## 2013-08-02 MED ORDER — CEPHALEXIN 500 MG PO CAPS: 500.0000 mg | ORAL_CAPSULE | Freq: Three times a day (TID) | ORAL | Status: DC

## 2013-08-02 MED ORDER — HYDROCOD POLST-CHLORPHEN POLST 10-8 MG/5ML PO LQCR: 5.0000 mL | Freq: Two times a day (BID) | ORAL | Status: DC | PRN

## 2013-08-02 NOTE — ED Provider Notes (Signed)
Addendum: The patient called back today to get results of her tests. She is extremely concerned about the bacteria in her urine. It was explained to her that this was probably a contaminant from vaginal secretions. She at length understood this after about 7 phone calls back and forth. She is still having symptoms of frequency, urgency, dysuria. She's also having nasal congestion with yellow drainage and cough productive yellow sputum. Prescriptions were sent to her pharmacy for cephalexin 500 mg 3 times a day and Tussionex cough syrup 1 teaspoon every 12 hours as needed. I suggested that if no better in 10 days she return for a recheck. She seemed to be satisfied with this course of treatment.  Reuben Likes, MD 08/02/13 [phone]

## 2014-03-09 ENCOUNTER — Other Ambulatory Visit: Payer: Self-pay | Admitting: Physical Medicine and Rehabilitation

## 2014-03-09 DIAGNOSIS — N838 Other noninflammatory disorders of ovary, fallopian tube and broad ligament: Secondary | ICD-10-CM

## 2022-04-14 LAB — COLOGUARD: COLOGUARD: POSITIVE — AB

## 2023-05-30 ENCOUNTER — Ambulatory Visit (HOSPITAL_COMMUNITY)
Admission: EM | Admit: 2023-05-30 | Discharge: 2023-05-30 | Disposition: A | Discharge status: Home / Self Care | Payer: BC Managed Care – PPO | Attending: Family Medicine | Admitting: Family Medicine

## 2023-05-30 ENCOUNTER — Encounter (HOSPITAL_COMMUNITY): Payer: Self-pay | Admitting: Emergency Medicine

## 2023-05-30 ENCOUNTER — Other Ambulatory Visit: Payer: Self-pay

## 2023-05-30 ENCOUNTER — Emergency Department (HOSPITAL_COMMUNITY)
Admission: EM | Admit: 2023-05-30 | Discharge: 2023-05-30 | Disposition: A | Discharge status: Home / Self Care | Payer: BC Managed Care – PPO | Attending: Emergency Medicine | Admitting: Emergency Medicine

## 2023-05-30 DIAGNOSIS — L0291 Cutaneous abscess, unspecified: Secondary | ICD-10-CM

## 2023-05-30 DIAGNOSIS — L03317 Cellulitis of buttock: Secondary | ICD-10-CM | POA: Insufficient documentation

## 2023-05-30 DIAGNOSIS — L0231 Cutaneous abscess of buttock: Secondary | ICD-10-CM

## 2023-05-30 LAB — COMPREHENSIVE METABOLIC PANEL
ALT: 9 U/L (ref 0–44)
AST: 13 U/L — ABNORMAL LOW (ref 15–41)
Albumin: 4 g/dL (ref 3.5–5.0)
Alkaline Phosphatase: 65 U/L (ref 38–126)
Anion gap: 14 (ref 5–15)
BUN: 7 mg/dL (ref 6–20)
CO2: 22 mmol/L (ref 22–32)
Calcium: 8.9 mg/dL (ref 8.9–10.3)
Chloride: 101 mmol/L (ref 98–111)
Creatinine, Ser: 0.82 mg/dL (ref 0.44–1.00)
GFR, Estimated: >60 mL/min (ref >60)
Glucose, Bld: 106 mg/dL — ABNORMAL HIGH (ref 70–99)
Potassium: 3.5 mmol/L (ref 3.5–5.1)
Sodium: 137 mmol/L (ref 135–145)
Total Bilirubin: 1.2 mg/dL (ref 0.3–1.2)
Total Protein: 7.3 g/dL (ref 6.5–8.1)

## 2023-05-30 LAB — CBC WITH DIFFERENTIAL/PLATELET
Abs Immature Granulocytes: 0.03 10*3/uL (ref 0.00–0.07)
Basophils Absolute: 0 10*3/uL (ref 0.0–0.1)
Basophils Relative: 0 %
Eosinophils Absolute: 0 10*3/uL (ref 0.0–0.5)
Eosinophils Relative: 0 %
HCT: 39.1 % (ref 36.0–46.0)
Hemoglobin: 13.2 g/dL (ref 12.0–15.0)
Immature Granulocytes: 0 %
Lymphocytes Relative: 14 %
Lymphs Abs: 1.7 10*3/uL (ref 0.7–4.0)
MCH: 31.6 pg (ref 26.0–34.0)
MCHC: 33.8 g/dL (ref 30.0–36.0)
MCV: 93.5 fL (ref 80.0–100.0)
Monocytes Absolute: 1 10*3/uL (ref 0.1–1.0)
Monocytes Relative: 8 %
Neutro Abs: 9.5 10*3/uL — ABNORMAL HIGH (ref 1.7–7.7)
Neutrophils Relative %: 78 %
Platelets: 248 10*3/uL (ref 150–400)
RBC: 4.18 MIL/uL (ref 3.87–5.11)
RDW: 12.4 % (ref 11.5–15.5)
WBC: 12.2 10*3/uL — ABNORMAL HIGH (ref 4.0–10.5)
nRBC: 0 % (ref 0.0–0.2)

## 2023-05-30 LAB — I-STAT CG4 LACTIC ACID, ED: Lactic Acid, Venous: 0.7 mmol/L (ref 0.5–1.9)

## 2023-05-30 MED ORDER — CLINDAMYCIN HCL 150 MG PO CAPS: 450.0000 mg | ORAL_CAPSULE | Freq: Three times a day (TID) | ORAL | 0 refills | Status: AC

## 2023-05-30 MED ORDER — ONDANSETRON HCL 4 MG/2ML IJ SOLN
4.0000 mg | Freq: Once | INTRAMUSCULAR | Status: AC
  Administered 2023-05-30: 4 mg via INTRAVENOUS
  Filled 2023-05-30: qty 2

## 2023-05-30 MED ORDER — OXYCODONE HCL 5 MG PO TABS
5.0000 mg | ORAL_TABLET | Freq: Once | ORAL | Status: AC
  Administered 2023-05-30: 5 mg via ORAL
  Filled 2023-05-30: qty 1

## 2023-05-30 MED ORDER — CLINDAMYCIN PHOSPHATE 600 MG/50ML IV SOLN
600.0000 mg | Freq: Once | INTRAVENOUS | Status: AC
  Administered 2023-05-30: 600 mg via INTRAVENOUS
  Filled 2023-05-30: qty 50

## 2023-05-30 MED ORDER — MORPHINE SULFATE (PF) 2 MG/ML IV SOLN
4.0000 mg | Freq: Once | INTRAVENOUS | Status: AC
  Administered 2023-05-30: 4 mg via INTRAMUSCULAR

## 2023-05-30 MED ORDER — LIDOCAINE HCL (PF) 1 % IJ SOLN
30.0000 mL | Freq: Once | INTRAMUSCULAR | Status: AC
  Administered 2023-05-30: 30 mL
  Filled 2023-05-30: qty 30

## 2023-05-30 MED ORDER — LIDOCAINE-EPINEPHRINE 1 %-1:100000 IJ SOLN
INTRAMUSCULAR | Status: AC
  Filled 2023-05-30: qty 1

## 2023-05-30 MED ORDER — FENTANYL CITRATE PF 50 MCG/ML IJ SOSY
50.0000 ug | PREFILLED_SYRINGE | Freq: Once | INTRAMUSCULAR | Status: AC
  Administered 2023-05-30: 50 ug via INTRAVENOUS
  Filled 2023-05-30: qty 1

## 2023-05-30 MED ORDER — MORPHINE SULFATE (PF) 2 MG/ML IV SOLN
INTRAVENOUS | Status: AC
  Filled 2023-05-30: qty 2

## 2023-05-30 MED ORDER — ONDANSETRON HCL 4 MG PO TABS: 4.0000 mg | ORAL_TABLET | Freq: Three times a day (TID) | ORAL | 0 refills | Status: AC | PRN

## 2023-05-30 NOTE — Discharge Instructions (Addendum)
Thank you for letting us take care of you today.   We drained the abscess to your buttock and gave you a dose of IV antibiotics. Please start the prescribed antibiotics tomorrow. You may take your home pain medication as needed for pain but should have significant relief in your pain over the next 24-48 hours as the infection clears. Do warm compresses/sitz baths as we discussed for 15-20 minutes every 2-3 hours to help area continue to drain.   Follow up with your PCP in 1 week for recheck. If you have worsening symptoms including fever, chest pain, shortness of breath, increasing size of abscess, or other new, concerning symptoms, return to nearest ED for re-evaluation.

## 2023-05-30 NOTE — ED Triage Notes (Signed)
Since Sunday fatigue, fevers, chills. Reports  Pt reports has abscess on buttock area that has been since Sunday as well. Reports has had drainage from it.  Reports had to strain to have a BM.

## 2023-05-30 NOTE — ED Notes (Signed)
I&D tray and medications set to bedside. MD notified

## 2023-05-30 NOTE — ED Notes (Signed)
Pt undressed and placed in gown per NT. Resting comfortably in bed

## 2023-05-30 NOTE — ED Triage Notes (Signed)
Pt sent from urgent care as they were unable to complete I & D d/t patient's pain.   Reports fevers/chills, and fatigue since Sunday.

## 2023-05-30 NOTE — ED Notes (Signed)
Patient is being discharged from the Urgent Care and sent to the Emergency Department via POV with parents. Per Dr Tracie Harrier, patient is in need of higher level of care due to abscess. Patient is aware and verbalizes understanding of plan of care.  Vitals:   05/30/23 1647  BP: (!) 150/84  Pulse: 87  Resp: 18  Temp: 99.6 F (37.6 C)  SpO2: 97%

## 2023-05-30 NOTE — Discharge Instructions (Signed)
Meds ordered this encounter  Medications   morphine (PF) 2 MG/ML injection 4 mg

## 2023-05-30 NOTE — ED Provider Notes (Signed)
  [facility]   [id number] 05/30/23 Arrival Time: 1608  ASSESSMENT & PLAN:  1. Abscess of buttock, left    Incision and Drainage Procedure Note  Anesthesia: 1% lidocaine with epinephrine  Procedure Details  The procedure, risks and complications have been discussed in detail (including, but not limited to pain and bleeding) with the patient.  The skin induration was prepped and draped in the usual fashion.  Meds ordered this encounter  Medications   morphine (PF) 2 MG/ML injection 4 mg   After local anesthesia, I&D of L buttock abscess attempted with a #11 blade. Despite my best efforts, patient just could not tolerate the procedure secondary to reported pain. I did get a little pus to drain from site but not enough for this to resolve with antibiotic treatment alone. Discussed with patient and family. Recommendation for ED evaluation made.  EBL: minimal Complications: pain.  Reviewed expectations re: course of current medical issues. Questions answered. Outlined signs and symptoms indicating need for more acute intervention. Patient verbalized understanding. After Visit Summary given.   SUBJECTIVE:  Kathleen Spence is a 52 y.o. female who presents with a possible infection of her LEFT buttock; x 3-4 days; increasing pain. Denies fever but does report chills and fatigue x 1-2 days. Denies bleeding/drainage.   OBJECTIVE:  Vitals:   05/30/23 1647  BP: (!) 150/84  Pulse: 87  Resp: 18  Temp: 99.6 F (37.6 C)  TempSrc: Oral  SpO2: 97%    General appearance: alert; appears to be in pain LEFT buttock: approx 4x6 cm area of erythema with central skin thickening; hot to touch; no active bleeding or drainage; induration does not extend to rectum Psychological: alert and cooperative; normal mood and affect  Allergies  Allergen Reactions   Tramadol Rash    History reviewed. No pertinent past medical history. Social History   Socioeconomic History   Marital  status: Single    Spouse name: Not on file   Number of children: Not on file   Years of education: Not on file   Highest education level: Not on file  Occupational History   Not on file  Tobacco Use   Smoking status: Every Day    Current packs/day: 0.50    Types: Cigarettes   Smokeless tobacco: Not on file  Substance and Sexual Activity   Alcohol use: No   Drug use: No   Sexual activity: Not on file  Other Topics Concern   Not on file  Social History Narrative   Not on file   Social Determinants of Health   Financial Resource Strain: Not on file  Food Insecurity: Not on file  Transportation Needs: Not on file  Physical Activity: Not on file  Stress: Not on file  Social Connections: Not on file   Family History  Problem Relation Age of Onset   Diabetes Mother    Hypertension Mother    Hypertension Father    History reviewed. No pertinent surgical history.          Mardella Layman, MD 05/30/23 [phone]

## 2023-05-30 NOTE — ED Provider Notes (Signed)
[facility] AT [facility] Provider Note   CSN: [id number] Arrival date & time: 05/30/23  4010     History  Chief Complaint  Patient presents with   Abscess    Kathleen Spence is a 52 y.o. female who presents to the ED with abscess to left buttock for the last 4 days.  Seen in urgent care where an incision and drainage was attempted but patient had difficulty with pain control even with IM dose of morphine and local anesthetic so sent to the ED.  No fevers at home.  No body aches.  Not taking anything for the pain.  Has not trialed any antibiotics or warm compresses/sitz baths.  No history of recurrent abscesses to this area.  No known bug bites.      Home Medications Prior to Admission medications   Medication Sig Start Date End Date Taking? Authorizing Provider  clindamycin (CLEOCIN) 150 MG capsule Take 3 capsules (450 mg total) by mouth 3 (three) times daily for 7 days. 05/30/23 06/06/23 Yes Brodin Gelpi L, PA-C  ondansetron (ZOFRAN) 4 MG tablet Take 1 tablet (4 mg total) by mouth every 8 (eight) hours as needed for up to 7 days for nausea or vomiting. 05/30/23 06/06/23 Yes Kamryn Messineo L, PA-C  chlorpheniramine-HYDROcodone (TUSSIONEX) 10-8 MG/5ML LQCR Take 5 mLs by mouth every 12 (twelve) hours as needed. 08/02/13   Reuben Likes, MD      Allergies    Tramadol    Review of Systems   Review of Systems  All other systems reviewed and are negative.   Physical Exam Updated Vital Signs BP 115/67   Pulse 74   Temp 98.9 F (37.2 C) (Oral)   Resp 16   Ht 5\' 3"  (1.6 m)   Wt 74.8 kg   LMP 07/20/2013   SpO2 97%   BMI 29.23 kg/m  Physical Exam Vitals and nursing note reviewed. Exam conducted with a chaperone present.  Constitutional:      General: She is not in acute distress.    Appearance: Normal appearance. She is not ill-appearing or toxic-appearing.  HENT:     Head: Normocephalic and atraumatic.     Mouth/Throat:     Mouth: Mucous  membranes are moist.  Eyes:     Conjunctiva/sclera: Conjunctivae normal.  Cardiovascular:     Rate and Rhythm: Normal rate and regular rhythm.  Pulmonary:     Effort: Pulmonary effort is normal.     Breath sounds: Normal breath sounds.  Abdominal:     General: Abdomen is flat.     Palpations: Abdomen is soft.     Tenderness: There is no abdominal tenderness.  Genitourinary:    Comments: Approximately 5 cm area of induration, increased warmth, tenderness and erythema with central area of skin thickening and small fluctuant area of ~1cm that appears to have purulent material underlying to L buttock, small amount of bloody drainage from small incision from previous provider, no extension to the rectum Musculoskeletal:        General: Normal range of motion.     Cervical back: Neck supple.     Right lower leg: No edema.     Left lower leg: No edema.  Skin:    General: Skin is warm and dry.     Capillary Refill: Capillary refill takes less than 2 seconds.  Neurological:     Mental Status: She is alert. Mental status is at baseline.  Psychiatric:  Behavior: Behavior normal.     ED Results / Procedures / Treatments   Labs (all labs ordered are listed, but only abnormal results are displayed) Labs Reviewed  COMPREHENSIVE METABOLIC PANEL - Abnormal; Notable for the following components:      Result Value   Glucose, Bld 106 (*)    AST 13 (*)    All other components within normal limits  CBC WITH DIFFERENTIAL/PLATELET - Abnormal; Notable for the following components:   WBC 12.2 (*)    Neutro Abs 9.5 (*)    All other components within normal limits  I-STAT CG4 LACTIC ACID, ED  I-STAT CG4 LACTIC ACID, ED    EKG None  Radiology No results found.  Procedures .Marland KitchenIncision and Drainage  Date/Time: 05/30/2023 10:10 PM  Performed by: Tonette Lederer, PA-C Authorized by: Tonette Lederer, PA-C   Consent:    Consent obtained:  Verbal   Consent given by:  Patient   Risks  discussed:  Bleeding, incomplete drainage, pain and infection   Alternatives discussed:  No treatment, delayed treatment and alternative treatment Universal protocol:    Procedure explained and questions answered to patient or proxy's satisfaction: yes     Immediately prior to procedure, a time out was called: yes     Patient identity confirmed:  Verbally with patient Location:    Type:  Abscess Pre-procedure details:    Skin preparation:  Povidone-iodine Sedation:    Sedation type:  None Anesthesia:    Anesthesia method:  Local infiltration   Local anesthetic:  Lidocaine 1% w/o epi Procedure type:    Complexity:  Simple Procedure details:    Ultrasound guidance: no     Needle aspiration: no     Incision types:  Single straight   Incision depth:  Subcutaneous   Wound management:  Probed and deloculated, irrigated with saline and extensive cleaning   Drainage:  Bloody and purulent   Drainage amount:  Moderate   Wound treatment:  Wound left open   Packing materials:  None Post-procedure details:    Procedure completion:  Tolerated well, no immediate complications     Medications Ordered in ED Medications  clindamycin (CLEOCIN) IVPB 600 mg (600 mg Intravenous New Bag/Given 05/30/23 2310)  fentaNYL (SUBLIMAZE) injection 50 mcg (50 mcg Intravenous Given 05/30/23 2217)  lidocaine (PF) (XYLOCAINE) 1 % injection 30 mL (30 mLs Infiltration Given 05/30/23 2218)  ondansetron (ZOFRAN) injection 4 mg (4 mg Intravenous Given 05/30/23 2217)  oxyCODONE (Oxy IR/ROXICODONE) immediate release tablet 5 mg (5 mg Oral Given 05/30/23 2310)    ED Course/ Medical Decision Making/ A&P                                 Medical Decision Making Amount and/or Complexity of Data Reviewed Labs: ordered. Decision-making details documented in ED Course.  Risk Prescription drug management.   Medical Decision Making:   Kathleen Spence is a 52 y.o. female who presented to the ED today with abscess detailed  above.    Additional history discussed with patient's family/caregivers.  Complete initial physical exam performed, notably the patient was nontoxic appearing. She had abscess and cellulitis area as above without rectal involvement. Reviewed and confirmed nursing documentation for past medical history, family history, social history.    Initial Assessment:   With the patient's presentation, differential diagnosis includes but is not limited to abscess, cellulitis, sepsis, insect bite, infected cyst, anorectal abscess, necrotizing  fasciitis.   This is most consistent with an acute complicated illness  Initial Plan:  Screening labs including CBC and Metabolic panel to evaluate for infectious or metabolic etiology of disease.  Lactic to assess for sepsis Pain management Incision and drainage Objective evaluation as below reviewed   Initial Study Results:   Laboratory  All laboratory results reviewed without evidence of clinically relevant pathology.   Exceptions include: WBC 12.2  Final Assessment and Plan:   52 year old female presents to the ED with abscess to the left buttock.  Started 3 to 4 days ago.  No fevers, body aches, vomiting, systemic symptoms.  Patient nontoxic-appearing but does appear uncomfortable.  She has a small purulent abscess as above with surrounding cellulitis.  Attempted drainage at urgent care but unsuccessful secondary to poor pain tolerance.  Sent here for sepsis rule out.  Minimal leukocytosis.  Lactic acid normal.  Did not suspect septic process.  Patient has symptoms cellulitic area to the left buttock that does not extend to the rectum.  Discussed with patient and parents at bedside recommendation for drainage.  Patient agreeable to proceed with pain management.  Tolerated procedure well with local anesthetic and fentanyl for pain control.  Will give IV dose of clindamycin here due to time at night and patient will start p.o. antibiotics tomorrow.  Discussed  importance of keeping open wound clean and doing warm compresses/sitz bath's at home to help with drainage and follow-up for continued or worsening symptoms.  Patient and parents agreeable.  Strict ED return precautions given, all questions answered, and stable for discharge.   Clinical Impression:  1. Abscess   2. Cellulitis of buttock      Discharge           Final Clinical Impression(s) / ED Diagnoses Final diagnoses:  Abscess  Cellulitis of buttock    Rx / DC Orders ED Discharge Orders          Ordered    clindamycin (CLEOCIN) 150 MG capsule  3 times daily        05/30/23 2318    ondansetron (ZOFRAN) 4 MG tablet  Every 8 hours PRN        05/30/23 2318              Tonette Lederer, PA-C 05/30/23 2324    Lorre Nick, MD 05/31/23 2247

## 2023-10-21 ENCOUNTER — Encounter (HOSPITAL_COMMUNITY): Payer: Self-pay

## 2023-10-21 ENCOUNTER — Ambulatory Visit (HOSPITAL_COMMUNITY)
Admission: EM | Admit: 2023-10-21 | Discharge: 2023-10-21 | Disposition: A | Discharge status: Home / Self Care | Payer: BC Managed Care – PPO

## 2023-10-21 DIAGNOSIS — J069 Acute upper respiratory infection, unspecified: Secondary | ICD-10-CM

## 2023-10-21 MED ORDER — PROMETHAZINE-DM 6.25-15 MG/5ML PO SYRP: 5.0000 mL | ORAL_SOLUTION | Freq: Three times a day (TID) | ORAL | 0 refills | Status: DC | PRN

## 2023-10-21 MED ORDER — AZITHROMYCIN 250 MG PO TABS: ORAL_TABLET | ORAL | 0 refills | Status: DC

## 2023-10-21 MED ORDER — PREDNISONE 10 MG PO TABS: 40.0000 mg | ORAL_TABLET | Freq: Every day | ORAL | 0 refills | Status: AC

## 2023-10-21 NOTE — ED Provider Notes (Signed)
[facility]    CSN: [id number] Arrival date & time: 10/21/23  1310      History   Chief Complaint Chief Complaint  Patient presents with   Sore Throat   Wheezing   Nasal Congestion    HPI Kathleen Spence is a 52 y.o. female.   52 year old female who presents to urgent care with complaints of productive cough, wheezing, nasal congestion, and fatigue.  This has been going on for at least 2-1/2 weeks now.  She denies shortness of breath, chest pain, nausea, vomiting, diarrhea, headache, fevers or chills.  She has had some tightness in her chest due to cough.  The mucus that she is coughing up is a thick yellow.  She has also been very fatigued.  The cough has been much worse at night especially when it is colder.  She has tried over-the-counter cough medications for the last 2 weeks without improvement.   Sore Throat Pertinent negatives include no chest pain, no abdominal pain and no shortness of breath.  Wheezing Associated symptoms: cough   Associated symptoms: no chest pain, no ear pain, no fever, no rash, no shortness of breath and no sore throat     History reviewed. No pertinent past medical history.  There are no active problems to display for this patient.   History reviewed. No pertinent surgical history.  OB History   No obstetric history on file.      Home Medications    Prior to Admission medications   Medication Sig Start Date End Date Taking? Authorizing Provider  BIJUVA 1-100 MG CAPS Take 1 tablet by mouth daily. 06/06/23  Yes [provider]  HYDROcodone-acetaminophen (NORCO) 10-325 MG tablet Take 1 tablet twice a day by oral route as needed. 10/03/23  Yes [provider]  chlorpheniramine-HYDROcodone (TUSSIONEX) 10-8 MG/5ML LQCR Take 5 mLs by mouth every 12 (twelve) hours as needed. 08/02/13   Reuben Likes, MD    Family History Family History  Problem Relation Age of Onset   Diabetes Mother    Hypertension Mother     Hypertension Father     Social History Social History   Tobacco Use   Smoking status: Every Day    Current packs/day: 0.50    Types: Cigarettes  Vaping Use   Vaping status: Never Used  Substance Use Topics   Alcohol use: No   Drug use: No     Allergies   Patient has no known allergies.   Review of Systems Review of Systems  Constitutional:  Negative for chills and fever.  HENT:  Positive for congestion and sinus pressure. Negative for ear pain and sore throat.   Eyes:  Negative for pain and visual disturbance.  Respiratory:  Positive for cough and wheezing. Negative for shortness of breath.   Cardiovascular:  Negative for chest pain and palpitations.  Gastrointestinal:  Negative for abdominal pain and vomiting.  Genitourinary:  Negative for dysuria and hematuria.  Musculoskeletal:  Negative for arthralgias and back pain.  Skin:  Negative for color change and rash.  Neurological:  Negative for seizures and syncope.  All other systems reviewed and are negative.    Physical Exam Triage Vital Signs ED Triage Vitals  Encounter Vitals Group     BP 10/21/23 1425 (!) 164/95     Systolic BP Percentile --      Diastolic BP Percentile --      Pulse Rate 10/21/23 1425 74     Resp 10/21/23 1425 18  Temp 10/21/23 1425 98.2 F (36.8 C)     Temp Source 10/21/23 1425 Oral     SpO2 10/21/23 1425 96 %     Weight 10/21/23 1421 150 lb (68 kg)     Height 10/21/23 1421 5' (1.524 m)     Head Circumference --      Peak Flow --      Pain Score 10/21/23 1421 5     Pain Loc --      Pain Education --      Exclude from Growth Chart --    No data found.  Updated Vital Signs BP (!) 164/95 (BP Location: Left Arm)   Pulse 74   Temp 98.2 F (36.8 C) (Oral)   Resp 18   Ht 5' (1.524 m)   Wt 150 lb (68 kg)   LMP 07/20/2013   SpO2 96%   BMI 29.29 kg/m   Visual Acuity Right Eye Distance:   Left Eye Distance:   Bilateral Distance:    Right Eye Near:   Left Eye Near:     Bilateral Near:     Physical Exam Vitals and nursing note reviewed.  Constitutional:      General: She is not in acute distress.    Appearance: She is well-developed.  HENT:     Head: Normocephalic and atraumatic.     Right Ear: Tympanic membrane normal. No drainage.     Left Ear: Tympanic membrane normal. No drainage.     Nose: Congestion present.     Mouth/Throat:     Mouth: Mucous membranes are moist.     Pharynx: Posterior oropharyngeal erythema (very mild) present.     Tonsils: No tonsillar exudate.  Eyes:     Conjunctiva/sclera: Conjunctivae normal.  Cardiovascular:     Rate and Rhythm: Normal rate and regular rhythm.     Heart sounds: No murmur heard. Pulmonary:     Effort: Pulmonary effort is normal. No respiratory distress.     Breath sounds: Normal breath sounds. No decreased air movement. No decreased breath sounds, wheezing or rhonchi.  Abdominal:     Palpations: Abdomen is soft.     Tenderness: There is no abdominal tenderness.  Musculoskeletal:        General: No swelling.     Cervical back: Neck supple.  Skin:    General: Skin is warm and dry.     Capillary Refill: Capillary refill takes less than 2 seconds.  Neurological:     Mental Status: She is alert.  Psychiatric:        Mood and Affect: Mood normal.      UC Treatments / Results  Labs (all labs ordered are listed, but only abnormal results are displayed) Labs Reviewed - No data to display  EKG   Radiology No results found.  Procedures Procedures (including critical care time)  Medications Ordered in UC Medications - No data to display  Initial Impression / Assessment and Plan / UC Course  I have reviewed the triage vital signs and the nursing notes.  Pertinent labs & imaging results that were available during my care of the patient were reviewed by me and considered in my medical decision making (see chart for details).     Acute upper respiratory infection  Upper respiratory  infection that has been going on for 2.5 weeks. Due to the severity of the symptoms and the duration of time we will treat with the following:  Azithromycin 250mg  Take 2 tablets today and  the 1 tablet daily for 4 more days. Prednisone 40 mg daily for 5 days. Take this in the morning. Promethazine DM 5 mL every 8 hours as needed for cough.  Use caution as this medication can cause drowsiness. Rest and hydrate Return to urgent care or PCP if symptoms worsen or fail to resolve.    Final Clinical Impressions(s) / UC Diagnoses   Final diagnoses:  Acute upper respiratory infection     Discharge Instructions      Upper respiratory infection that has been going on for 2.5 weeks. Due to the severity of the symptoms and the duration of time we will treat with the following:  Azithromycin 250mg  Take 2 tablets today and the 1 tablet daily for 4 more days. Prednisone 40 mg daily for 5 days. Take this in the morning. Promethazine DM 5 mL every 8 hours as needed for cough.  Use caution as this medication can cause drowsiness. Rest and hydrate Return to urgent care or PCP if symptoms worsen or fail to resolve.     ED Prescriptions   None    PDMP not reviewed this encounter.   Landis Martins, [place] 10/21/23 1447

## 2023-10-21 NOTE — ED Triage Notes (Signed)
Pt presents with complaints of dry throat, "cough with yellow mucus", and "wheezing in my chest" x 2.5 weeks. Pt reports taking OTC severe chest & congestion x 1 week, in addition to Mucinex and Zyrtec with little improvement. Pt currently rates her overall discomfort a 5/10. Pt denies SOB.

## 2023-10-21 NOTE — Discharge Instructions (Addendum)
Upper respiratory infection that has been going on for 2.5 weeks. Due to the severity of the symptoms and the duration of time we will treat with the following:  Azithromycin 250mg  Take 2 tablets today and the 1 tablet daily for 4 more days. Prednisone 40 mg daily for 5 days. Take this in the morning. Promethazine DM 5 mL every 8 hours as needed for cough.  Use caution as this medication can cause drowsiness. Rest and hydrate Return to urgent care or PCP if symptoms worsen or fail to resolve.

## 2023-11-06 ENCOUNTER — Emergency Department (HOSPITAL_COMMUNITY)
Admission: EM | Admit: 2023-11-06 | Discharge: 2023-11-07 | Discharge status: Against Medical Advice | Payer: BC Managed Care – PPO | Attending: Emergency Medicine | Admitting: Emergency Medicine

## 2023-11-06 ENCOUNTER — Encounter (HOSPITAL_COMMUNITY): Payer: Self-pay | Admitting: Pharmacy Technician

## 2023-11-06 ENCOUNTER — Other Ambulatory Visit: Payer: Self-pay

## 2023-11-06 DIAGNOSIS — Z5321 Procedure and treatment not carried out due to patient leaving prior to being seen by health care provider: Secondary | ICD-10-CM | POA: Insufficient documentation

## 2023-11-06 DIAGNOSIS — M79601 Pain in right arm: Secondary | ICD-10-CM | POA: Diagnosis present

## 2023-11-06 NOTE — ED Triage Notes (Signed)
 Pt here with reports of R arm pain/swelling since November. Seen at [facility] at that time and had xrays done.

## 2023-11-06 NOTE — ED Provider Triage Note (Signed)
 Emergency Medicine Provider Triage Evaluation Note  Kathleen Spence , a 53 y.o. female  was evaluated in triage.  Pt complains of pain in right arm starting in October. Describes pain as fire.   Was seen by [facility] and got XR with no osseous abnormality. Had steroid and pain eased off  Review of Systems  Positive: Arm pain Negative: fevers  Physical Exam  BP (!) 179/101 (BP Location: Left Arm)   Pulse 80   Temp 98.4 F (36.9 C)   Resp 16   LMP 07/20/2013   SpO2 100%  Gen:   Awake, no distress   Resp:  Normal effort  MSK:   Moves extremities without difficulty  Other:  Pain to medial right forearm  Medical Decision Making  Medically screening exam initiated at 6:53 PM.  Appropriate orders placed.  Kathleen Spence was informed that the remainder of the evaluation will be completed by another provider, this initial triage assessment does not replace that evaluation, and the importance of remaining in the ED until their evaluation is complete.  Offered pt XR and outpatient US  but she declined stating that she wanted to be evaluated by a doctor and have her blood tested for sepsis   [name], PA 11/06/23 1930

## 2023-11-07 NOTE — ED Notes (Signed)
Called for vitals x3 no answer °

## 2024-08-05 ENCOUNTER — Encounter (HOSPITAL_COMMUNITY): Payer: Self-pay

## 2024-08-05 ENCOUNTER — Ambulatory Visit (HOSPITAL_COMMUNITY)
Admission: EM | Admit: 2024-08-05 | Discharge: 2024-08-05 | Disposition: A | Discharge status: Home / Self Care | Attending: Internal Medicine | Admitting: Internal Medicine

## 2024-08-05 ENCOUNTER — Ambulatory Visit (INDEPENDENT_AMBULATORY_CARE_PROVIDER_SITE_OTHER)

## 2024-08-05 DIAGNOSIS — R051 Acute cough: Secondary | ICD-10-CM

## 2024-08-05 DIAGNOSIS — R0602 Shortness of breath: Secondary | ICD-10-CM | POA: Diagnosis not present

## 2024-08-05 DIAGNOSIS — R062 Wheezing: Secondary | ICD-10-CM

## 2024-08-05 DIAGNOSIS — J208 Acute bronchitis due to other specified organisms: Secondary | ICD-10-CM | POA: Diagnosis not present

## 2024-08-05 DIAGNOSIS — J069 Acute upper respiratory infection, unspecified: Secondary | ICD-10-CM

## 2024-08-05 MED ORDER — ALBUTEROL SULFATE HFA 108 (90 BASE) MCG/ACT IN AERS: 1.0000 | INHALATION_SPRAY | Freq: Four times a day (QID) | RESPIRATORY_TRACT | 0 refills | Status: AC | PRN

## 2024-08-05 MED ORDER — PREDNISONE 20 MG PO TABS: 40.0000 mg | ORAL_TABLET | Freq: Every day | ORAL | 0 refills | Status: AC

## 2024-08-05 MED ORDER — AZITHROMYCIN 250 MG PO TABS: ORAL_TABLET | ORAL | 0 refills | Status: AC

## 2024-08-05 NOTE — ED Triage Notes (Signed)
 Pt c/o cough with nasal and chest congestion x2wks. States now having dark sputum and wheezing. States mucinex and cough syrup at night with little relief.

## 2024-08-05 NOTE — ED Provider Notes (Signed)
 [facility]    CSN: [id number] Arrival date & time: 08/05/24  1411      History   Chief Complaint Chief Complaint  Patient presents with   Cough    HPI YARIELIZ WASSER is a 53 y.o. female.   53 y.o. female who presents to urgent care with complaints of cough, congestion, sinus pressure, shortness of breath, wheezing, chest tightness and overall feeling fatigue. This started about 2 weeks ago and has progressively worsened. She started taking mucinex and OTC cough medication and feels it got worse after that. She has noted in the last few days wheezing and shortness of breath. The coughing is causing tightness in her chest. She denies nausea, vomiting, diarrhea, fevers, chills, abdominal pain.   Cough Associated symptoms: fever and shortness of breath   Associated symptoms: no chest pain, no chills, no ear pain, no rash and no sore throat     History reviewed. No pertinent past medical history.  There are no active problems to display for this patient.   History reviewed. No pertinent surgical history.  OB History   No obstetric history on file.      Home Medications    Prior to Admission medications   Medication Sig Start Date End Date Taking? Authorizing Provider  albuterol (VENTOLIN HFA) 108 (90 Base) MCG/ACT inhaler Inhale 1-2 puffs into the lungs every 6 (six) hours as needed for wheezing or shortness of breath. 08/05/24  Yes Betzalel Umbarger A, PA-C  azithromycin  (ZITHROMAX ) 250 MG tablet Take first 2 tablets together, then 1 every day until finished. 08/05/24  Yes Pressley Barsky A, PA-C  predniSONE  (DELTASONE ) 20 MG tablet Take 2 tablets (40 mg total) by mouth daily with breakfast for 5 days. 08/05/24 08/10/24 Yes Perl Kerney A, PA-C  BIJUVA 1-100 MG CAPS Take 1 tablet by mouth daily. 06/06/23   [provider]  HYDROcodone -acetaminophen  (NORCO) 10-325 MG tablet Take 1 tablet twice a day by oral route as needed. 10/03/23   [provider]    Family History Family History  Problem Relation Age of Onset   Diabetes Mother    Hypertension Mother    Hypertension Father     Social History Social History   Tobacco Use   Smoking status: Every Day    Current packs/day: 0.50    Types: Cigarettes  Vaping Use   Vaping status: Never Used  Substance Use Topics   Alcohol use: No   Drug use: No     Allergies   Patient has no known allergies.   Review of Systems Review of Systems  Constitutional:  Positive for fatigue and fever. Negative for chills.  HENT:  Positive for congestion, sinus pressure and sinus pain. Negative for ear pain and sore throat.   Eyes:  Negative for pain and visual disturbance.  Respiratory:  Positive for cough (Productive with brown mucus), chest tightness and shortness of breath.   Cardiovascular:  Negative for chest pain and palpitations.  Gastrointestinal:  Negative for abdominal pain and vomiting.  Genitourinary:  Negative for dysuria and hematuria.  Musculoskeletal:  Negative for arthralgias and back pain.  Skin:  Negative for color change and rash.  Neurological:  Negative for seizures and syncope.  All other systems reviewed and are negative.    Physical Exam Triage Vital Signs ED Triage Vitals  Encounter Vitals Group     BP 08/05/24 1528 (!) 151/91     Girls Systolic BP Percentile --  Girls Diastolic BP Percentile --      Boys Systolic BP Percentile --      Boys Diastolic BP Percentile --      Pulse Rate 08/05/24 1528 67     Resp 08/05/24 1528 16     Temp 08/05/24 1528 98.3 F (36.8 C)     Temp Source 08/05/24 1528 Oral     SpO2 08/05/24 1528 97 %     Weight --      Height --      Head Circumference --      Peak Flow --      Pain Score 08/05/24 1526 2     Pain Loc --      Pain Education --      Exclude from Growth Chart --    No data found.  Updated Vital Signs BP (!) 151/91 (BP Location: Left Arm)   Pulse 67   Temp 98.3 F (36.8 C) (Oral)    Resp 16   LMP 07/20/2013   SpO2 97%   Visual Acuity Right Eye Distance:   Left Eye Distance:   Bilateral Distance:    Right Eye Near:   Left Eye Near:    Bilateral Near:     Physical Exam Vitals and nursing note reviewed.  Constitutional:      General: She is not in acute distress.    Appearance: She is well-developed.  HENT:     Head: Normocephalic and atraumatic.     Right Ear: Tympanic membrane normal.     Left Ear: Tympanic membrane normal.     Mouth/Throat:     Mouth: Mucous membranes are moist.     Pharynx: Postnasal drip present.  Eyes:     Conjunctiva/sclera: Conjunctivae normal.  Cardiovascular:     Rate and Rhythm: Normal rate and regular rhythm.     Heart sounds: No murmur heard. Pulmonary:     Effort: Pulmonary effort is normal. No tachypnea or respiratory distress.     Breath sounds: Examination of the right-upper field reveals decreased breath sounds and wheezing. Examination of the left-upper field reveals wheezing. Examination of the right-middle field reveals decreased breath sounds and wheezing. Examination of the left-middle field reveals wheezing. Examination of the right-lower field reveals decreased breath sounds. Decreased breath sounds and wheezing present. No rhonchi.  Abdominal:     Palpations: Abdomen is soft.     Tenderness: There is no abdominal tenderness.  Musculoskeletal:        General: No swelling.     Cervical back: Neck supple.  Skin:    General: Skin is warm and dry.     Capillary Refill: Capillary refill takes less than 2 seconds.  Neurological:     Mental Status: She is alert.  Psychiatric:        Mood and Affect: Mood normal.      UC Treatments / Results  Labs (all labs ordered are listed, but only abnormal results are displayed) Labs Reviewed - No data to display  EKG   Radiology No results found.  Procedures Procedures (including critical care time)  Medications Ordered in UC Medications - No data to  display  Initial Impression / Assessment and Plan / UC Course  I have reviewed the triage vital signs and the nursing notes.  Pertinent labs & imaging results that were available during my care of the patient were reviewed by me and considered in my medical decision making (see chart for details).     Acute  cough - Plan: DG Chest 2 View, DG Chest 2 View  Shortness of breath - Plan: DG Chest 2 View, DG Chest 2 View  Wheezing - Plan: DG Chest 2 View, DG Chest 2 View  Acute bronchitis due to other specified organisms  Acute upper respiratory infection   Chest x-ray done today. The final evaluation by the radiologist is still pending but on brief evaluation there does not appear to be any pneumonia.  After the radiologist has read the x-ray, if anything changes from our evaluation then we will contact you. Symptoms and physical exam findings are consistent with acute bronchitis and lower respiratory infection. Vital signs and physical exam findings are reassuring. Due to the duration and severity of symptoms, we will treat with the following:    Azithromycin  250mg  Take 2 tablets today and the 1 tablet daily for 4 more days.  Prednisone  40 mg (2 tablets) once daily for 5 days. Take this in the morning.  This is a steroid to help with inflammation and pain.  Albuterol inhaler 1-2 puffs every 6 hours as needed for wheezing/shortness of breath.  Make sure to stay hydrated by drinking plenty of water. Return to urgent care or PCP if symptoms worsen or fail to resolve in the next 5-7 days.     Final Clinical Impressions(s) / UC Diagnoses   Final diagnoses:  Acute cough  Shortness of breath  Wheezing  Acute bronchitis due to other specified organisms  Acute upper respiratory infection     Discharge Instructions      Chest x-ray done today. The final evaluation by the radiologist is still pending but on brief evaluation there does not appear to be any pneumonia.  After the radiologist  has read the x-ray, if anything changes from our evaluation then we will contact you. Symptoms and physical exam findings are consistent with acute bronchitis and lower respiratory infection. Vital signs and physical exam findings are reassuring. Due to the duration and severity of symptoms, we will treat with the following:    Azithromycin  250mg  Take 2 tablets today and the 1 tablet daily for 4 more days.  Prednisone  40 mg (2 tablets) once daily for 5 days. Take this in the morning.  This is a steroid to help with inflammation and pain.  Albuterol inhaler 1-2 puffs every 6 hours as needed for wheezing/shortness of breath.  Make sure to stay hydrated by drinking plenty of water. Return to urgent care or PCP if symptoms worsen or fail to resolve in the next 5-7 days.       ED Prescriptions     Medication Sig Dispense Auth. Provider   azithromycin  (ZITHROMAX ) 250 MG tablet Take first 2 tablets together, then 1 every day until finished. 6 tablet Teresa Norris A, PA-C   predniSONE  (DELTASONE ) 20 MG tablet Take 2 tablets (40 mg total) by mouth daily with breakfast for 5 days. 10 tablet Teresa Norris A, PA-C   albuterol (VENTOLIN HFA) 108 (90 Base) MCG/ACT inhaler Inhale 1-2 puffs into the lungs every 6 (six) hours as needed for wheezing or shortness of breath. 6.7 g Teresa Norris LABOR, [place]      PDMP not reviewed this encounter.   Teresa Norris LABOR, PA-C 08/05/24 1636

## 2024-08-05 NOTE — Discharge Instructions (Addendum)
 Chest x-ray done today. The final evaluation by the radiologist is still pending but on brief evaluation there does not appear to be any pneumonia.  After the radiologist has read the x-ray, if anything changes from our evaluation then we will contact you. Symptoms and physical exam findings are consistent with acute bronchitis and lower respiratory infection. Vital signs and physical exam findings are reassuring. Due to the duration and severity of symptoms, we will treat with the following:    Azithromycin  250mg  Take 2 tablets today and the 1 tablet daily for 4 more days.  Prednisone  40 mg (2 tablets) once daily for 5 days. Take this in the morning.  This is a steroid to help with inflammation and pain.  Albuterol inhaler 1-2 puffs every 6 hours as needed for wheezing/shortness of breath.  Make sure to stay hydrated by drinking plenty of water. Return to urgent care or PCP if symptoms worsen or fail to resolve in the next 5-7 days.

## 2024-08-12 ENCOUNTER — Encounter (HOSPITAL_COMMUNITY): Payer: Self-pay | Admitting: Emergency Medicine

## 2024-08-12 ENCOUNTER — Ambulatory Visit (HOSPITAL_COMMUNITY)
Admission: EM | Admit: 2024-08-12 | Discharge: 2024-08-12 | Disposition: A | Discharge status: Home / Self Care | Attending: Family Medicine | Admitting: Family Medicine

## 2024-08-12 DIAGNOSIS — J0110 Acute frontal sinusitis, unspecified: Secondary | ICD-10-CM | POA: Diagnosis not present

## 2024-08-12 DIAGNOSIS — H7292 Unspecified perforation of tympanic membrane, left ear: Secondary | ICD-10-CM | POA: Diagnosis not present

## 2024-08-12 MED ORDER — BENZONATATE 200 MG PO CAPS: 200.0000 mg | ORAL_CAPSULE | Freq: Three times a day (TID) | ORAL | 0 refills | Status: AC | PRN

## 2024-08-12 MED ORDER — AMOXICILLIN-POT CLAVULANATE 875-125 MG PO TABS: 1.0000 | ORAL_TABLET | Freq: Two times a day (BID) | ORAL | 0 refills | Status: AC

## 2024-08-12 NOTE — ED Provider Notes (Signed)
 [facility]    CSN: [id number] Arrival date & time: 08/12/24  1348      History   Chief Complaint Chief Complaint  Patient presents with   Ear Fullness    HPI Kathleen Spence is a 53 y.o. female.    Ear Fullness  Patient is here for left ear pain.   She was seen last week for sinus infection, and given a zpack for a sinus infection, and steroids.  Finished those over the weekend.  She blew her nose on Sunday, and felt a pop in the left ear.   She put a q-tip in the ear, and noted blood.  The ear feels stopped up.  Feels like something is draining out, but cannot see it.        History reviewed. No pertinent past medical history.  There are no active problems to display for this patient.   History reviewed. No pertinent surgical history.  OB History   No obstetric history on file.      Home Medications    Prior to Admission medications   Medication Sig Start Date End Date Taking? Authorizing Provider  albuterol (VENTOLIN HFA) 108 (90 Base) MCG/ACT inhaler Inhale 1-2 puffs into the lungs every 6 (six) hours as needed for wheezing or shortness of breath. 08/05/24   White, Elizabeth A, PA-C  azithromycin  (ZITHROMAX ) 250 MG tablet Take first 2 tablets together, then 1 every day until finished. 08/05/24   White, Elizabeth A, PA-C  BIJUVA 1-100 MG CAPS Take 1 tablet by mouth daily. 06/06/23   [provider]  HYDROcodone -acetaminophen  (NORCO) 10-325 MG tablet Take 1 tablet twice a day by oral route as needed. 10/03/23   [provider]    Family History Family History  Problem Relation Age of Onset   Diabetes Mother    Hypertension Mother    Hypertension Father     Social History Social History   Tobacco Use   Smoking status: Every Day    Current packs/day: 0.50    Types: Cigarettes  Vaping Use   Vaping status: Never Used  Substance Use Topics   Alcohol use: No   Drug use: No     Allergies   Patient has no known  allergies.   Review of Systems Review of Systems  Constitutional: Negative.   HENT:  Positive for congestion, ear discharge, ear pain, rhinorrhea and sinus pressure.   Cardiovascular: Negative.   Gastrointestinal: Negative.   Genitourinary: Negative.   Musculoskeletal: Negative.   Psychiatric/Behavioral: Negative.       Physical Exam Triage Vital Signs ED Triage Vitals  Encounter Vitals Group     BP 08/12/24 1436 (!) 168/97     Girls Systolic BP Percentile --      Girls Diastolic BP Percentile --      Boys Systolic BP Percentile --      Boys Diastolic BP Percentile --      Pulse Rate 08/12/24 1436 66     Resp 08/12/24 1436 17     Temp 08/12/24 1436 98.3 F (36.8 C)     Temp Source 08/12/24 1436 Oral     SpO2 08/12/24 1436 98 %     Weight --      Height --      Head Circumference --      Peak Flow --      Pain Score 08/12/24 1435 4     Pain Loc --      Pain Education --  Exclude from Growth Chart --    No data found.  Updated Vital Signs BP (!) 168/97 (BP Location: Right Arm)   Pulse 66   Temp 98.3 F (36.8 C) (Oral)   Resp 17   LMP 07/20/2013   SpO2 98%   Visual Acuity Right Eye Distance:   Left Eye Distance:   Bilateral Distance:    Right Eye Near:   Left Eye Near:    Bilateral Near:     Physical Exam Constitutional:      General: She is not in acute distress.    Appearance: Normal appearance. She is normal weight. She is not ill-appearing or toxic-appearing.  HENT:     Right Ear: Tympanic membrane normal.     Left Ear: Drainage present. Tympanic membrane is perforated.     Nose:     Right Sinus: Maxillary sinus tenderness present.     Left Sinus: Maxillary sinus tenderness present.  Cardiovascular:     Rate and Rhythm: Normal rate and regular rhythm.  Pulmonary:     Effort: Pulmonary effort is normal.     Breath sounds: Normal breath sounds.  Musculoskeletal:     Cervical back: Normal range of motion and neck supple. Tenderness present.   Lymphadenopathy:     Cervical: No cervical adenopathy.  Neurological:     General: No focal deficit present.     Mental Status: She is alert.  Psychiatric:        Mood and Affect: Mood normal.      UC Treatments / Results  Labs (all labs ordered are listed, but only abnormal results are displayed) Labs Reviewed - No data to display  EKG   Radiology No results found.  Procedures Procedures (including critical care time)  Medications Ordered in UC Medications - No data to display  Initial Impression / Assessment and Plan / UC Course  I have reviewed the triage vital signs and the nursing notes.  Pertinent labs & imaging results that were available during my care of the patient were reviewed by me and considered in my medical decision making (see chart for details).   Final Clinical Impressions(s) / UC Diagnoses   Final diagnoses:  Perforation of ear drum, left  Acute non-recurrent frontal sinusitis     Discharge Instructions      You were seen today for a perforation of your ear drum.  I have sent out augmentin to treat this, and a medication for cough.  Please follow up with your Ear Nose and Throat specialist for monitoring of the ear drum.     ED Prescriptions     Medication Sig Dispense Auth. Provider   amoxicillin -clavulanate (AUGMENTIN) 875-125 MG tablet Take 1 tablet by mouth every 12 (twelve) hours for 10 days. 20 tablet Kalis Friese, MD   benzonatate (TESSALON) 200 MG capsule Take 1 capsule (200 mg total) by mouth 3 (three) times daily as needed for cough. 21 capsule Darral Longs, MD      PDMP not reviewed this encounter.   Darral Longs, MD 08/12/24 [phone]

## 2024-08-12 NOTE — ED Triage Notes (Signed)
 Pt reports was seen here last week for sinus infection. Sunday blew nose and ear popped. Stuck q-tip in ear and saw blood. Reports tried to get into ENT and told not to wait to be seen. Having swelling and fullness to left side of face. Finished z pack that was prescribed last week.

## 2024-08-12 NOTE — Discharge Instructions (Signed)
 You were seen today for a perforation of your ear drum.  I have sent out augmentin to treat this, and a medication for cough.  Please follow up with your Ear Nose and Throat specialist for monitoring of the ear drum.
